# Patient Record
Sex: Male | Born: 2000 | Hispanic: No | Marital: Single | Smoking: Never smoker
Health system: Southern US, Community
[De-identification: ages and names within clinical notes are randomized; demographics above are authoritative.]

## PROBLEM LIST (undated history)

## (undated) ENCOUNTER — Emergency Department (HOSPITAL_BASED_OUTPATIENT_CLINIC_OR_DEPARTMENT_OTHER): Admission: EM | Payer: No Typology Code available for payment source | Source: Home / Self Care

## (undated) DIAGNOSIS — L439 Lichen planus, unspecified: Secondary | ICD-10-CM

## (undated) DIAGNOSIS — L661 Lichen planopilaris, unspecified: Secondary | ICD-10-CM

## (undated) DIAGNOSIS — S92403A Displaced unspecified fracture of unspecified great toe, initial encounter for closed fracture: Secondary | ICD-10-CM

## (undated) DIAGNOSIS — L732 Hidradenitis suppurativa: Secondary | ICD-10-CM

## (undated) HISTORY — PX: DENTAL SURGERY: SHX609

## (undated) HISTORY — PX: OTHER SURGICAL HISTORY: SHX169

## (undated) HISTORY — DX: Lichen planopilaris: L66.1

## (undated) HISTORY — DX: Displaced unspecified fracture of unspecified great toe, initial encounter for closed fracture: S92.403A

---
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
## 2020-01-22 ENCOUNTER — Other Ambulatory Visit: Payer: Self-pay

## 2020-01-22 ENCOUNTER — Emergency Department (HOSPITAL_COMMUNITY)
Admission: EM | Admit: 2020-01-22 | Discharge: 2020-01-22 | Disposition: A | Discharge status: Home / Self Care | Payer: Medicaid Other | Attending: Emergency Medicine | Admitting: Emergency Medicine

## 2020-01-22 ENCOUNTER — Encounter (HOSPITAL_COMMUNITY): Payer: Self-pay

## 2020-01-22 ENCOUNTER — Emergency Department (HOSPITAL_COMMUNITY): Payer: Medicaid Other

## 2020-01-22 DIAGNOSIS — M7918 Myalgia, other site: Secondary | ICD-10-CM

## 2020-01-22 DIAGNOSIS — M25512 Pain in left shoulder: Secondary | ICD-10-CM | POA: Diagnosis not present

## 2020-01-22 DIAGNOSIS — Y9389 Activity, other specified: Secondary | ICD-10-CM | POA: Diagnosis not present

## 2020-01-22 DIAGNOSIS — M79622 Pain in left upper arm: Secondary | ICD-10-CM | POA: Insufficient documentation

## 2020-01-22 DIAGNOSIS — Y998 Other external cause status: Secondary | ICD-10-CM | POA: Insufficient documentation

## 2020-01-22 DIAGNOSIS — Y92410 Unspecified street and highway as the place of occurrence of the external cause: Secondary | ICD-10-CM | POA: Insufficient documentation

## 2020-01-22 MED ORDER — METHOCARBAMOL 750 MG PO TABS
750.0000 mg | ORAL_TABLET | Freq: Four times a day (QID) | ORAL | 0 refills | Status: DC
Start: 2020-01-22 — End: 2022-11-17

## 2020-01-22 MED ORDER — IBUPROFEN 600 MG PO TABS: 600.0000 mg | ORAL_TABLET | Freq: Four times a day (QID) | ORAL | 0 refills | Status: AC | PRN
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
## 2020-04-27 DIAGNOSIS — J029 Acute pharyngitis, unspecified: Secondary | ICD-10-CM | POA: Diagnosis not present

## 2020-04-27 DIAGNOSIS — R059 Cough, unspecified: Secondary | ICD-10-CM | POA: Diagnosis not present

## 2020-04-27 DIAGNOSIS — R0981 Nasal congestion: Secondary | ICD-10-CM | POA: Diagnosis not present

## 2020-10-01 DIAGNOSIS — H5213 Myopia, bilateral: Secondary | ICD-10-CM | POA: Diagnosis not present

## 2020-10-13 ENCOUNTER — Ambulatory Visit: Payer: Medicaid Other | Admitting: Pediatrics

## 2021-01-18 ENCOUNTER — Telehealth: Payer: Self-pay | Admitting: Pediatrics

## 2021-01-18 NOTE — Telephone Encounter (Signed)
Left a voice message for Nicholas Farmer to call [facility] for appointment to address concern.

## 2021-01-18 NOTE — Telephone Encounter (Signed)
Patient lvm requesting a referral for Dermatology.

## 2021-01-30 DIAGNOSIS — Z6835 Body mass index (BMI) 35.0-35.9, adult: Secondary | ICD-10-CM | POA: Diagnosis not present

## 2021-01-30 DIAGNOSIS — L700 Acne vulgaris: Secondary | ICD-10-CM | POA: Diagnosis not present

## 2021-06-26 DIAGNOSIS — Z0000 Encounter for general adult medical examination without abnormal findings: Secondary | ICD-10-CM | POA: Diagnosis not present

## 2021-06-28 DIAGNOSIS — L659 Nonscarring hair loss, unspecified: Secondary | ICD-10-CM | POA: Diagnosis not present

## 2021-06-28 DIAGNOSIS — L989 Disorder of the skin and subcutaneous tissue, unspecified: Secondary | ICD-10-CM | POA: Diagnosis not present

## 2021-08-16 DIAGNOSIS — L659 Nonscarring hair loss, unspecified: Secondary | ICD-10-CM | POA: Diagnosis not present

## 2021-09-02 DIAGNOSIS — L669 Cicatricial alopecia, unspecified: Secondary | ICD-10-CM | POA: Diagnosis not present

## 2021-12-13 ENCOUNTER — Other Ambulatory Visit: Payer: Self-pay | Admitting: Pediatrics

## 2021-12-13 DIAGNOSIS — L700 Acne vulgaris: Secondary | ICD-10-CM

## 2021-12-13 DIAGNOSIS — L661 Lichen planopilaris: Secondary | ICD-10-CM | POA: Diagnosis not present

## 2021-12-18 ENCOUNTER — Telehealth: Payer: Self-pay

## 2021-12-18 DIAGNOSIS — Z09 Encounter for follow-up examination after completed treatment for conditions other than malignant neoplasm: Secondary | ICD-10-CM

## 2021-12-18 NOTE — Telephone Encounter (Signed)
[facility] mailed pt Adolescent Transition Letter. Pt has not been seen since 2021. Mychart message also sent indicating pt need to transition to adult PCP. Pt can contact [facility] if needing assistance.     Kenn File, BSW, QP Case Manager [facility] [employer] [facility] Office: [phone] Direct Number: [phone]

## 2021-12-27 DIAGNOSIS — L700 Acne vulgaris: Secondary | ICD-10-CM | POA: Diagnosis not present

## 2021-12-27 DIAGNOSIS — L661 Lichen planopilaris: Secondary | ICD-10-CM | POA: Diagnosis not present

## 2022-01-27 DIAGNOSIS — N489 Disorder of penis, unspecified: Secondary | ICD-10-CM | POA: Diagnosis not present

## 2022-01-27 DIAGNOSIS — Z113 Encounter for screening for infections with a predominantly sexual mode of transmission: Secondary | ICD-10-CM | POA: Diagnosis not present

## 2022-03-12 DIAGNOSIS — L661 Lichen planopilaris: Secondary | ICD-10-CM | POA: Diagnosis not present

## 2022-04-23 ENCOUNTER — Ambulatory Visit: Payer: Medicaid Other | Admitting: Family Medicine

## 2022-05-17 ENCOUNTER — Emergency Department (HOSPITAL_BASED_OUTPATIENT_CLINIC_OR_DEPARTMENT_OTHER)
Admission: EM | Admit: 2022-05-17 | Discharge: 2022-05-18 | Disposition: A | Discharge status: Home / Self Care | Payer: Medicaid Other | Attending: Emergency Medicine | Admitting: Emergency Medicine

## 2022-05-17 ENCOUNTER — Encounter (HOSPITAL_BASED_OUTPATIENT_CLINIC_OR_DEPARTMENT_OTHER): Payer: Self-pay

## 2022-05-17 ENCOUNTER — Emergency Department (HOSPITAL_BASED_OUTPATIENT_CLINIC_OR_DEPARTMENT_OTHER): Payer: Medicaid Other

## 2022-05-17 ENCOUNTER — Emergency Department (HOSPITAL_BASED_OUTPATIENT_CLINIC_OR_DEPARTMENT_OTHER): Payer: Medicaid Other | Admitting: Radiology

## 2022-05-17 ENCOUNTER — Other Ambulatory Visit: Payer: Self-pay

## 2022-05-17 DIAGNOSIS — S199XXA Unspecified injury of neck, initial encounter: Secondary | ICD-10-CM | POA: Diagnosis not present

## 2022-05-17 DIAGNOSIS — S0003XA Contusion of scalp, initial encounter: Secondary | ICD-10-CM | POA: Diagnosis not present

## 2022-05-17 DIAGNOSIS — Y92481 Parking lot as the place of occurrence of the external cause: Secondary | ICD-10-CM | POA: Insufficient documentation

## 2022-05-17 DIAGNOSIS — S62657A Nondisplaced fracture of medial phalanx of left little finger, initial encounter for closed fracture: Secondary | ICD-10-CM | POA: Diagnosis not present

## 2022-05-17 DIAGNOSIS — S6992XA Unspecified injury of left wrist, hand and finger(s), initial encounter: Secondary | ICD-10-CM | POA: Diagnosis present

## 2022-05-17 DIAGNOSIS — M7989 Other specified soft tissue disorders: Secondary | ICD-10-CM | POA: Diagnosis not present

## 2022-05-17 DIAGNOSIS — M79641 Pain in right hand: Secondary | ICD-10-CM | POA: Diagnosis not present

## 2022-05-17 MED ORDER — CEPHALEXIN 500 MG PO CAPS: 500.0000 mg | ORAL_CAPSULE | Freq: Three times a day (TID) | ORAL | 0 refills | Status: AC
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
## 2022-05-17 NOTE — Discharge Instructions (Addendum)
You were evaluated in the Emergency Department and after careful evaluation, we did not find any emergent condition requiring admission or further testing in the hospital.  Your exam/testing today was overall reassuring.  Your x-rays show a broken pinky finger.  CT scans did not show any injuries to your head or neck.  Follow-up with the hand specialist provided to ensure proper healing.  Use the splint.  Use Tylenol or Motrin as needed for pain.  Take the Keflex antibiotic to prevent infection.  Please return to the Emergency Department if you experience any worsening of your condition.  Thank you for allowing [place] to be a part of your care.

## 2022-06-19 DIAGNOSIS — Z7251 High risk heterosexual behavior: Secondary | ICD-10-CM | POA: Diagnosis not present

## 2022-06-19 DIAGNOSIS — Z114 Encounter for screening for human immunodeficiency virus [HIV]: Secondary | ICD-10-CM | POA: Diagnosis not present

## 2022-09-16 DIAGNOSIS — L661 Lichen planopilaris: Secondary | ICD-10-CM | POA: Diagnosis not present

## 2022-11-01 ENCOUNTER — Encounter (HOSPITAL_BASED_OUTPATIENT_CLINIC_OR_DEPARTMENT_OTHER): Payer: Self-pay

## 2022-11-01 ENCOUNTER — Emergency Department (HOSPITAL_BASED_OUTPATIENT_CLINIC_OR_DEPARTMENT_OTHER)
Admission: EM | Admit: 2022-11-01 | Discharge: 2022-11-01 | Disposition: A | Discharge status: Home / Self Care | Payer: Medicaid Other | Attending: Emergency Medicine | Admitting: Emergency Medicine

## 2022-11-01 DIAGNOSIS — B9789 Other viral agents as the cause of diseases classified elsewhere: Secondary | ICD-10-CM | POA: Diagnosis not present

## 2022-11-01 DIAGNOSIS — J029 Acute pharyngitis, unspecified: Secondary | ICD-10-CM | POA: Insufficient documentation

## 2022-11-01 DIAGNOSIS — J028 Acute pharyngitis due to other specified organisms: Secondary | ICD-10-CM | POA: Diagnosis not present

## 2022-11-01 DIAGNOSIS — Z1152 Encounter for screening for COVID-19: Secondary | ICD-10-CM | POA: Insufficient documentation

## 2022-11-01 LAB — SARS CORONAVIRUS 2 BY RT PCR: SARS Coronavirus 2 by RT PCR: NEGATIVE

## 2022-11-01 LAB — GROUP A STREP BY PCR: Group A Strep by PCR: NOT DETECTED

## 2022-11-01 MED ORDER — NAPROXEN 375 MG PO TABS: 375.0000 mg | ORAL_TABLET | Freq: Two times a day (BID) | ORAL | 0 refills | Status: DC

## 2022-11-01 MED ORDER — METHYLPREDNISOLONE 4 MG PO TBPK: ORAL_TABLET | ORAL | 0 refills | Status: DC

## 2022-11-01 MED ORDER — DEXAMETHASONE SODIUM PHOSPHATE 10 MG/ML IJ SOLN
10.0000 mg | Freq: Once | INTRAMUSCULAR | Status: AC
  Administered 2022-11-01: 10 mg via INTRAMUSCULAR
  Filled 2022-11-01: qty 1

## 2022-11-01 MED ORDER — KETOROLAC TROMETHAMINE 60 MG/2ML IM SOLN
60.0000 mg | Freq: Once | INTRAMUSCULAR | Status: AC
  Administered 2022-11-01: 60 mg via INTRAMUSCULAR
  Filled 2022-11-01: qty 2

## 2022-11-01 NOTE — ED Triage Notes (Signed)
Sore throat for three days.  States pain to swallow.  Cough congestion

## 2022-11-01 NOTE — Discharge Instructions (Signed)
Contact a health care provider if: You have large, tender lumps in your neck. You have a rash. You cough up green, yellow-brown, or bloody mucus. Get help right away if: Your neck becomes stiff. You drool or are unable to swallow liquids. You cannot drink or take medicines without vomiting. You have severe pain that does not go away, even after you take medicine. You have trouble breathing, and it is not caused by a stuffy nose. You have new pain and swelling in your joints such as the knees, ankles, wrists, or elbows. These symptoms may represent a serious problem that is an emergency. Do not wait to see if the symptoms will go away. Get medical help right away. Call your local emergency services (911 in the [place] Do not drive yourself to the hospital. 

## 2022-11-01 NOTE — ED Provider Notes (Signed)
[place] EMERGENCY DEPARTMENT AT [facility] Provider Note   CSN: [id number] Arrival date & time: 11/01/22  2045     History  Chief Complaint  Patient presents with   Sore Throat    Nicholas Farmer is a 22 y.o. male presents emergency department chief complaint of sore throat.  Patient states that he has had 3 days of progressively worsening sore throat.  He is able to swallow fluids.  He denies fever or cough.  No contacts with similar symptoms.  No change in voice.    Sore Throat       Home Medications Prior to Admission medications   Medication Sig Start Date End Date Taking? Authorizing Provider  acetaminophen (TYLENOL) 500 MG tablet Take 500 mg by mouth every 6 (six) hours as needed for moderate pain or headache.    [provider]  Clindamycin-Benzoyl Per, Refr, gel Apply to acne lesions twice a day.  Wash face and let dry before application. 06/10/19   Maree Erie, MD  DIFFERIN 0.1 % gel Apply to acne lesions at bedtime when needed 06/10/19   Maree Erie, MD  ibuprofen (ADVIL) 600 MG tablet Take 1 tablet (600 mg total) by mouth every 6 (six) hours as needed. 01/22/20   Lorre Nick, MD  ibuprofen (ADVIL,MOTRIN) 200 MG tablet Take 600 mg by mouth every 6 (six) hours as needed for headache or moderate pain.    [provider]  methocarbamol (ROBAXIN-750) 750 MG tablet Take 1 tablet (750 mg total) by mouth 4 (four) times daily. 01/22/20   Lorre Nick, MD      Allergies    Patient has no known allergies.    Review of Systems   Review of Systems  Physical Exam Updated Vital Signs BP 131/77 (BP Location: Right Arm)   Pulse 71   Temp 98.2 F (36.8 C) (Oral)   Resp 18   Ht 5\' 10"  (1.778 m)   Wt 111.1 kg   SpO2 96%   BMI 35.15 kg/m  Physical Exam Vitals and nursing note reviewed.  Constitutional:      General: He is not in acute distress.    Appearance: He is well-developed. He is not diaphoretic.  HENT:     Head:  Normocephalic and atraumatic.     Mouth/Throat:     Mouth: Mucous membranes are moist.     Pharynx: Uvula midline. Pharyngeal swelling, oropharyngeal exudate and posterior oropharyngeal erythema present. No uvula swelling.  Eyes:     General: No scleral icterus.    Conjunctiva/sclera: Conjunctivae normal.  Cardiovascular:     Rate and Rhythm: Normal rate and regular rhythm.     Heart sounds: Normal heart sounds.  Pulmonary:     Effort: Pulmonary effort is normal. No respiratory distress.     Breath sounds: Normal breath sounds.  Abdominal:     Palpations: Abdomen is soft.     Tenderness: There is no abdominal tenderness.  Musculoskeletal:     Cervical back: Normal range of motion and neck supple.  Skin:    General: Skin is warm and dry.  Neurological:     Mental Status: He is alert.  Psychiatric:        Behavior: Behavior normal.     ED Results / Procedures / Treatments   Labs (all labs ordered are listed, but only abnormal results are displayed) Labs Reviewed  SARS CORONAVIRUS 2 BY RT PCR  GROUP A STREP BY PCR    EKG None  Radiology No results found.  Procedures Procedures    Medications Ordered in ED Medications  ketorolac (TORADOL) injection 60 mg (has no administration in time range)  dexamethasone (DECADRON) injection 10 mg (has no administration in time range)    ED Course/ Medical Decision Making/ A&P                             Medical Decision Making Patient here with sore throat.  No evidence of peritonsillar abscess or deep space infection in the neck.  I ordered a COVID test which was negative, group A strep test negative.  Will treat with Decadron and Toradol, otherwise discharged with anti-inflammatories and Medrol Dosepak.  Patient appears otherwise appropriate for discharge at this time  Risk Prescription drug management.           Final Clinical Impression(s) / ED Diagnoses Final diagnoses:  Viral pharyngitis    Rx / DC  Orders ED Discharge Orders     None         Arthor Captain, PA-C 11/01/22 2348    Loetta Rough, MD 11/04/22 0025

## 2022-11-17 ENCOUNTER — Emergency Department (HOSPITAL_BASED_OUTPATIENT_CLINIC_OR_DEPARTMENT_OTHER)
Admission: EM | Admit: 2022-11-17 | Discharge: 2022-11-17 | Disposition: A | Discharge status: Home / Self Care | Payer: Medicaid Other | Attending: Emergency Medicine | Admitting: Emergency Medicine

## 2022-11-17 ENCOUNTER — Other Ambulatory Visit: Payer: Self-pay

## 2022-11-17 ENCOUNTER — Encounter (HOSPITAL_BASED_OUTPATIENT_CLINIC_OR_DEPARTMENT_OTHER): Payer: Self-pay

## 2022-11-17 DIAGNOSIS — L02214 Cutaneous abscess of groin: Secondary | ICD-10-CM | POA: Insufficient documentation

## 2022-11-17 MED ORDER — HYDROCODONE-ACETAMINOPHEN 5-325 MG PO TABS
2.0000 | ORAL_TABLET | Freq: Once | ORAL | Status: AC
  Administered 2022-11-17: 2 via ORAL
  Filled 2022-11-17: qty 2

## 2022-11-17 MED ORDER — DOXYCYCLINE HYCLATE 100 MG PO TABS
100.0000 mg | ORAL_TABLET | Freq: Once | ORAL | Status: AC
  Administered 2022-11-17: 100 mg via ORAL
  Filled 2022-11-17: qty 1

## 2022-11-17 MED ORDER — DOXYCYCLINE HYCLATE 100 MG PO CAPS: 100.0000 mg | ORAL_CAPSULE | Freq: Two times a day (BID) | ORAL | 0 refills | Status: DC

## 2022-11-17 MED ORDER — LIDOCAINE HCL (PF) 1 % IJ SOLN
5.0000 mL | Freq: Once | INTRAMUSCULAR | Status: AC
  Administered 2022-11-17: 5 mL via INTRADERMAL
  Filled 2022-11-17: qty 5

## 2022-11-17 NOTE — ED Triage Notes (Signed)
Abscess left side suprapubic area

## 2022-11-17 NOTE — ED Notes (Signed)
Offered pt a snack with pain meds.  He declined. States he will eat when he gets home.

## 2022-11-17 NOTE — ED Provider Notes (Signed)
[place] EMERGENCY DEPARTMENT AT [facility] Provider Note   CSN: [id number] Arrival date & time: 11/17/22  0243     History  Chief Complaint  Patient presents with   Abscess    Nicholas Farmer is a 22 y.o. male.  Patient is a 22 year old male presenting with complaints of pain and swelling in the right inguinal region.  He had a small area that drained pus earlier today.  He denies any fevers or chills.  He denies any injury or trauma  The history is provided by the patient.       Home Medications Prior to Admission medications   Medication Sig Start Date End Date Taking? Authorizing Provider  acetaminophen (TYLENOL) 500 MG tablet Take 500 mg by mouth every 6 (six) hours as needed for moderate pain or headache.    [provider]  Clindamycin-Benzoyl Per, Refr, gel Apply to acne lesions twice a day.  Wash face and let dry before application. 06/10/19   Maree Erie, MD  DIFFERIN 0.1 % gel Apply to acne lesions at bedtime when needed 06/10/19   Maree Erie, MD  ibuprofen (ADVIL) 600 MG tablet Take 1 tablet (600 mg total) by mouth every 6 (six) hours as needed. 01/22/20   Lorre Nick, MD  ibuprofen (ADVIL,MOTRIN) 200 MG tablet Take 600 mg by mouth every 6 (six) hours as needed for headache or moderate pain.    [provider]  methocarbamol (ROBAXIN-750) 750 MG tablet Take 1 tablet (750 mg total) by mouth 4 (four) times daily. 01/22/20   Lorre Nick, MD  methylPREDNISolone (MEDROL DOSEPAK) 4 MG TBPK tablet Use as directed 11/01/22   Arthor Captain, PA-C  naproxen (NAPROSYN) 375 MG tablet Take 1 tablet (375 mg total) by mouth 2 (two) times daily with a meal. 11/01/22   Arthor Captain, PA-C      Allergies    Patient has no known allergies.    Review of Systems   Review of Systems  All other systems reviewed and are negative.   Physical Exam Updated Vital Signs BP 124/74 (BP Location: Right Arm)   Pulse 76   Temp 97.8 F (36.6 C)  (Oral)   Resp 16   Ht 5\' 10"  (1.778 m)   Wt 111.1 kg   SpO2 99%   BMI 35.15 kg/m  Physical Exam Vitals and nursing note reviewed.  Constitutional:      Appearance: Normal appearance.  HENT:     Head: Normocephalic and atraumatic.  Pulmonary:     Effort: Pulmonary effort is normal.  Skin:    General: Skin is warm and dry.     Comments: In the left inguinal region, there is a 2 cm x 3 cm swollen, indurated, fluctuant lesion.  There is overlying erythema.  Neurological:     Mental Status: He is alert and oriented to person, place, and time.     ED Results / Procedures / Treatments   Labs (all labs ordered are listed, but only abnormal results are displayed) Labs Reviewed - No data to display  EKG None  Radiology No results found.  Procedures Procedures   INCISION AND DRAINAGE Performed by: Geoffery Lyons Consent: Verbal consent obtained. Risks and benefits: risks, benefits and alternatives were discussed Type: abscess  Body area: Left inguinal region  Anesthesia: local infiltration  Incision was made with a scalpel.  Local anesthetic: lidocaine 1% without epinephrine  Anesthetic total: 2 ml  Complexity: complex Blunt dissection to break up loculations  Drainage: purulent  Drainage amount: Moderate  Packing material: No packing placed  Patient tolerance: Patient tolerated the procedure well with no immediate complications.    Medications Ordered in ED Medications  lidocaine (PF) (XYLOCAINE) 1 % injection 5 mL (has no administration in time range)    ED Course/ Medical Decision Making/ A&P  Abscess incised and drained as above.  Patient will be discharged with doxycycline, warm compresses, and return as needed.  Final Clinical Impression(s) / ED Diagnoses Final diagnoses:  None    Rx / DC Orders ED Discharge Orders     None         Geoffery Lyons, MD 11/17/22 [phone]

## 2022-11-17 NOTE — ED Notes (Signed)
Pt requesting something for pain prior to d/c. Will update MD.

## 2022-11-17 NOTE — Discharge Instructions (Signed)
Begin taking doxycycline as prescribed.  Apply warm compresses as frequently as possible for the next several days.  Return to the emergency department if your symptoms significantly worsen or change. 

## 2022-11-19 DIAGNOSIS — L661 Lichen planopilaris: Secondary | ICD-10-CM | POA: Diagnosis not present

## 2023-01-02 DIAGNOSIS — L700 Acne vulgaris: Secondary | ICD-10-CM | POA: Diagnosis not present

## 2023-01-02 DIAGNOSIS — L661 Lichen planopilaris: Secondary | ICD-10-CM | POA: Diagnosis not present

## 2023-01-02 DIAGNOSIS — L739 Follicular disorder, unspecified: Secondary | ICD-10-CM | POA: Diagnosis not present

## 2023-01-15 DIAGNOSIS — L02214 Cutaneous abscess of groin: Secondary | ICD-10-CM | POA: Diagnosis not present

## 2023-02-13 DIAGNOSIS — L739 Follicular disorder, unspecified: Secondary | ICD-10-CM | POA: Diagnosis not present

## 2023-02-13 DIAGNOSIS — L700 Acne vulgaris: Secondary | ICD-10-CM | POA: Diagnosis not present

## 2023-02-13 DIAGNOSIS — L661 Lichen planopilaris: Secondary | ICD-10-CM | POA: Diagnosis not present

## 2023-03-12 DIAGNOSIS — L732 Hidradenitis suppurativa: Secondary | ICD-10-CM | POA: Diagnosis not present

## 2023-03-18 DIAGNOSIS — H5213 Myopia, bilateral: Secondary | ICD-10-CM | POA: Diagnosis not present

## 2023-03-24 DIAGNOSIS — L732 Hidradenitis suppurativa: Secondary | ICD-10-CM | POA: Diagnosis not present

## 2023-03-27 DIAGNOSIS — L739 Follicular disorder, unspecified: Secondary | ICD-10-CM | POA: Diagnosis not present

## 2023-03-27 DIAGNOSIS — L700 Acne vulgaris: Secondary | ICD-10-CM | POA: Diagnosis not present

## 2023-03-27 DIAGNOSIS — L661 Lichen planopilaris: Secondary | ICD-10-CM | POA: Diagnosis not present

## 2023-04-10 ENCOUNTER — Encounter (HOSPITAL_BASED_OUTPATIENT_CLINIC_OR_DEPARTMENT_OTHER): Payer: Self-pay

## 2023-04-10 ENCOUNTER — Emergency Department (HOSPITAL_BASED_OUTPATIENT_CLINIC_OR_DEPARTMENT_OTHER)
Admission: EM | Admit: 2023-04-10 | Discharge: 2023-04-10 | Disposition: A | Discharge status: Home / Self Care | Payer: Medicaid Other | Attending: Emergency Medicine | Admitting: Emergency Medicine

## 2023-04-10 ENCOUNTER — Other Ambulatory Visit: Payer: Self-pay

## 2023-04-10 DIAGNOSIS — L0291 Cutaneous abscess, unspecified: Secondary | ICD-10-CM

## 2023-04-10 DIAGNOSIS — L02214 Cutaneous abscess of groin: Secondary | ICD-10-CM | POA: Insufficient documentation

## 2023-04-10 MED ORDER — KETOROLAC TROMETHAMINE 60 MG/2ML IM SOLN
60.0000 mg | Freq: Once | INTRAMUSCULAR | Status: AC
  Administered 2023-04-10: 60 mg via INTRAMUSCULAR
  Filled 2023-04-10: qty 2

## 2023-04-10 MED ORDER — OXYCODONE-ACETAMINOPHEN 5-325 MG PO TABS: 1.0000 | ORAL_TABLET | Freq: Four times a day (QID) | ORAL | 0 refills | Status: DC | PRN

## 2023-04-10 MED ORDER — OXYCODONE-ACETAMINOPHEN 5-325 MG PO TABS
2.0000 | ORAL_TABLET | Freq: Once | ORAL | Status: AC
  Administered 2023-04-10: 2 via ORAL
  Filled 2023-04-10: qty 2

## 2023-04-10 MED ORDER — LIDOCAINE-EPINEPHRINE (PF) 2 %-1:200000 IJ SOLN
10.0000 mL | Freq: Once | INTRAMUSCULAR | Status: AC
  Administered 2023-04-10: 10 mL
  Filled 2023-04-10: qty 20

## 2023-04-10 NOTE — ED Provider Notes (Signed)
[place] EMERGENCY DEPARTMENT AT [facility] Provider Note   CSN: [id number] Arrival date & time: 04/10/23  0206     History  Chief Complaint  Patient presents with   Abscess    Nicholas Farmer is a 22 y.o. male.  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  Also has a history of abscesses in the groin.  Had at incision and drainage a few months ago.  This wound has been there for couple weeks but just recently got more painful and more swollen.  Fevers.  No urinary issues.  No severe pain.   Abscess      Home Medications Prior to Admission medications   Medication Sig Start Date End Date Taking? Authorizing Provider  oxyCODONE-acetaminophen (PERCOCET) 5-325 MG tablet Take 1 tablet by mouth every 6 (six) hours as needed. 04/10/23  Yes Sharisa Toves, Barbara Cower, MD  acetaminophen (TYLENOL) 500 MG tablet Take 500 mg by mouth every 6 (six) hours as needed for moderate pain or headache.    [provider]  Clindamycin-Benzoyl Per, Refr, gel Apply to acne lesions twice a day.  Wash face and let dry before application. 06/10/19   Maree Erie, MD  DIFFERIN 0.1 % gel Apply to acne lesions at bedtime when needed 06/10/19   Maree Erie, MD  doxycycline (VIBRAMYCIN) 100 MG capsule Take 1 capsule (100 mg total) by mouth 2 (two) times daily. One po bid x 7 days 11/17/22   Geoffery Lyons, MD  hydroxychloroquine (PLAQUENIL) 200 MG tablet Take 200 mg by mouth daily.    [provider]  ibuprofen (ADVIL) 600 MG tablet Take 1 tablet (600 mg total) by mouth every 6 (six) hours as needed. 01/22/20   Lorre Nick, MD  ibuprofen (ADVIL,MOTRIN) 200 MG tablet Take 600 mg by mouth every 6 (six) hours as needed for headache or moderate pain.    [provider]      Allergies    Patient has no known allergies.    Review of Systems   Review of Systems  Physical Exam Updated Vital Signs BP (!) 144/86   Pulse 80   Temp 98.3 F  (36.8 C) (Oral)   Resp 20   Ht 5\' 10"  (1.778 m)   Wt 111.1 kg   SpO2 100%   BMI 35.14 kg/m  Physical Exam Vitals and nursing note reviewed.  Constitutional:      Appearance: He is well-developed.  HENT:     Head: Normocephalic and atraumatic.  Cardiovascular:     Rate and Rhythm: Normal rate.  Pulmonary:     Effort: Pulmonary effort is normal. No respiratory distress.  Abdominal:     General: There is no distension.  Genitourinary:    Comments: 2 x 3 cm fluctuant area in the left inguinal fold.  No surrounding cellulitis.  Does have some induration in other areas associated with multiple healing open wounds.  No crepitus or tenderness elsewhere. Musculoskeletal:        General: Normal range of motion.     Cervical back: Normal range of motion.  Neurological:     Mental Status: He is alert.     ED Results / Procedures / Treatments   Labs (all labs ordered are listed, but only abnormal results are displayed) Labs Reviewed - No data to display  EKG None  Radiology No results found.  Procedures .Marland KitchenIncision and Drainage  Date/Time: 04/10/2023 4:54 AM  Performed by: Marily Memos, MD  Authorized by: Marily Memos, MD   Consent:    Consent obtained:  Verbal   Consent given by:  Patient   Risks, benefits, and alternatives were discussed: yes     Risks discussed:  Bleeding, incomplete drainage, pain, infection and damage to other organs   Alternatives discussed:  No treatment and delayed treatment Universal protocol:    Procedure explained and questions answered to patient or proxy's satisfaction: yes     Relevant documents present and verified: yes     Patient identity confirmed:  Verbally with patient Location:    Type:  Abscess   Size:  3x2   Location:  Anogenital   Anogenital location: left inguinal fold. Pre-procedure details:    Skin preparation:  Povidone-iodine Sedation:    Sedation type:  None Anesthesia:    Anesthesia method:  Local infiltration    Local anesthetic:  Lidocaine 2% WITH epi Procedure type:    Complexity:  Simple Procedure details:    Incision types:  Single straight   Wound management:  Probed and deloculated and irrigated with saline   Drainage:  Bloody and purulent   Drainage amount:  Moderate   Wound treatment:  Wound left open Post-procedure details:    Procedure completion:  Tolerated     Medications Ordered in ED Medications  oxyCODONE-acetaminophen (PERCOCET/ROXICET) 5-325 MG per tablet 2 tablet (2 tablets Oral Given 04/10/23 0313)  ketorolac (TORADOL) injection 60 mg (60 mg Intramuscular Given 04/10/23 0313)  lidocaine-EPINEPHrine (XYLOCAINE W/EPI) 2 %-1:200000 (PF) injection 10 mL (10 mLs Infiltration Given 04/10/23 1610)    ED Course/ Medical Decision Making/ A&P                                 Medical Decision Making Risk Prescription drug management.  No s/s of fourniers. Abscess I&D as above. Already on abx. Has derm follow up. 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   Final Clinical Impression(s) / ED Diagnoses Final diagnoses:  Abscess    Rx / DC Orders ED Discharge Orders          Ordered    oxyCODONE-acetaminophen (PERCOCET) 5-325 MG tablet  Every 6 hours PRN        04/10/23 0432              Chantilly Linskey, Barbara Cower, MD 04/10/23 [phone]

## 2023-04-10 NOTE — Discharge Instructions (Signed)
Abscesses and cellulitis are often caused by bacteria that live on your skin naturally all the time.  One way to get rid of this is to put a cup of bleach in a bathtub of water soaking for 10 minutes a day for a week.  If you do this, realize the bleach will stain your clothes, towels, carpets, rugs and anything else with die.  It will also make your feet slippery when used about a tub to be careful not to fall.  Another thing that works very well is chlorhexidine washes.  You can buy chlorhexidine wipes at the store and cleanse your whole body with them once a day for 7 days along with putting mupirocin ointment in your nose and finding chlorhexidine mouthwash to rinse her mouth with twice a day.

## 2023-04-10 NOTE — ED Triage Notes (Addendum)
POV from home, A&O x 4, GCS 15, amb to triage  Pt c/o abscess to left groin area, no drainage. 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

## 2023-04-18 ENCOUNTER — Encounter (HOSPITAL_BASED_OUTPATIENT_CLINIC_OR_DEPARTMENT_OTHER): Payer: Self-pay

## 2023-04-18 ENCOUNTER — Emergency Department (HOSPITAL_BASED_OUTPATIENT_CLINIC_OR_DEPARTMENT_OTHER)
Admission: EM | Admit: 2023-04-18 | Discharge: 2023-04-18 | Disposition: A | Discharge status: Home / Self Care | Payer: Medicaid Other | Attending: Emergency Medicine | Admitting: Emergency Medicine

## 2023-04-18 ENCOUNTER — Other Ambulatory Visit: Payer: Self-pay

## 2023-04-18 DIAGNOSIS — L732 Hidradenitis suppurativa: Secondary | ICD-10-CM | POA: Diagnosis not present

## 2023-04-18 HISTORY — DX: Lichen planopilaris, unspecified: L66.10

## 2023-04-18 HISTORY — DX: Lichen planus, unspecified: L43.9

## 2023-04-18 MED ORDER — PREDNISONE 20 MG PO TABS: ORAL_TABLET | ORAL | 0 refills | Status: AC

## 2023-04-18 MED ORDER — CLINDAMYCIN HCL 150 MG PO CAPS: 150.0000 mg | ORAL_CAPSULE | Freq: Three times a day (TID) | ORAL | 0 refills | Status: DC

## 2023-04-18 NOTE — Discharge Instructions (Addendum)
It was a pleasure taking care of you today. You have a condition called Hidradenitis Suppurativa. Please contact:   Doctor Tammi Sou, MD He is at [facility] Clinical trials   Phone Number:  [phone]  Tell them you were referred by Dr. Hassan Rowan   A good resource to understand your condition is the [email or web address] website.   Sincerely,  Manuela Neptune, MD

## 2023-04-18 NOTE — ED Triage Notes (Signed)
In for eval of abscesses to groin area. New abscess to right groin this am that he wants to be drained. Has been seeing his dermatologist and been on several antibiotics. Diagnosed with lichen planus and lichen planopilaris.

## 2023-04-18 NOTE — ED Provider Notes (Addendum)
EMERGENCY DEPARTMENT AT [facility] Provider Note   CSN: [id number] Arrival date & time: 04/18/23  4098     History Chief Complaint  Patient presents with   Abscess    HPI Nicholas Farmer is a 22 y.o. male presenting for a recurrent abscesses on his groin and buttocks. He was previously treated with Doxycycline and Clindamycin, Prednisone. He found little relief with Clindamycin, no relief with Doxycycline. Hx of acne.   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  Patient's recorded medical, surgical, social, medication list and allergies were reviewed in the Snapshot window as part of the initial history.   Review of Systems   Review of Systems  Constitutional:  Negative for chills and fever.  Has many abscesses on face, buttocks and suprapubic region  Physical Exam Updated Vital Signs BP (!) 149/85 (BP Location: Right Arm)   Pulse 66   Temp 98.4 F (36.9 C) (Oral)   Resp 16   Ht 5\' 10"  (1.778 m)   Wt 111.1 kg   SpO2 99%   BMI 35.14 kg/m  Physical Exam Cardiovascular:     Rate and Rhythm: Normal rate and regular rhythm.     Heart sounds: No murmur heard.    No friction rub. No gallop.  Pulmonary:     Effort: No respiratory distress.     Breath sounds: No wheezing, rhonchi or rales.  Skin:    Comments: Draining nodules, tunnels, and pustules over the suprapubic region.  Pustules and nodules on cheeks, mouth and chin.    ED Course/ Medical Decision Making/ A&P    Medical Decision Making:    Nicholas Farmer is a 22 y.o. male who presented to the ED today with complaints of recurrent abscesses, scarring and tunneling as detailed above.     Patient's presentation is complicated by their history of acne.   Complete initial physical exam performed, notably the patient had multiple draining nodules and tunnels.       Reviewed and confirmed nursing documentation for past medical history,  family history, social history.    Initial Assessment:   With the patient's presentation, the most likely diagnosis is Hidradenitis Suppurativa. Other diagnoses were considered including (but not limited to) abscess, bacterial skin infection. These are considered less likely due to history of present illness and physical exam findings.    Reassessment and Plan:    The patient has Hidradenitis Suppurativa Hurley Stage III. Has found some but minor relief with antibiotics. Will most likely need to go on a biological. Has seen a dermatologist but has come to the ED/Urgent care about 5 times since April for drainage of recurrent abscesses. The nature of the condition was explained to him, resources and education provided. All questions answered. Patient was not taking clindamycin as prescribed, refill for two weeks sent, quick prednisone taper for pain relief and contact information for Dr. Tammi Sou (HS Specialist) at [facility] provided.  Clinical Impression:  1. Hidradenitis suppurativa      Discharge   Final Clinical Impression(s) / ED Diagnoses Final diagnoses:  Hidradenitis suppurativa    Rx / DC Orders ED Discharge Orders          Ordered    clindamycin (CLEOCIN) 150 MG capsule  3 times daily        04/18/23 0816    predniSONE (DELTASONE) 20 MG tablet  Q breakfast        04/18/23 0816  Hassan Rowan Washington, MD 04/18/23 3220    Manuela Neptune, MD 04/18/23 2542    Melene Plan, DO 04/18/23 7062

## 2023-05-14 DIAGNOSIS — H5213 Myopia, bilateral: Secondary | ICD-10-CM | POA: Diagnosis not present

## 2023-05-28 ENCOUNTER — Other Ambulatory Visit: Payer: Self-pay

## 2023-05-28 ENCOUNTER — Emergency Department (HOSPITAL_BASED_OUTPATIENT_CLINIC_OR_DEPARTMENT_OTHER)
Admission: EM | Admit: 2023-05-28 | Discharge: 2023-05-29 | Disposition: A | Discharge status: Home / Self Care | Payer: Medicaid Other | Attending: Emergency Medicine | Admitting: Emergency Medicine

## 2023-05-28 ENCOUNTER — Encounter (HOSPITAL_BASED_OUTPATIENT_CLINIC_OR_DEPARTMENT_OTHER): Payer: Self-pay

## 2023-05-28 DIAGNOSIS — L0231 Cutaneous abscess of buttock: Secondary | ICD-10-CM | POA: Insufficient documentation

## 2023-05-28 DIAGNOSIS — L0291 Cutaneous abscess, unspecified: Secondary | ICD-10-CM

## 2023-05-28 MED ORDER — LIDOCAINE-EPINEPHRINE (PF) 2 %-1:200000 IJ SOLN
10.0000 mL | Freq: Once | INTRAMUSCULAR | Status: AC
  Administered 2023-05-28: 10 mL
  Filled 2023-05-28: qty 20

## 2023-05-28 MED ORDER — OXYCODONE HCL 5 MG PO TABS
5.0000 mg | ORAL_TABLET | Freq: Once | ORAL | Status: AC
  Administered 2023-05-28: 5 mg via ORAL
  Filled 2023-05-28: qty 1

## 2023-05-28 NOTE — ED Notes (Addendum)
Lidocaine and tray at bedside. Suture cart at door. Pt states he has ride home.

## 2023-05-28 NOTE — ED Triage Notes (Signed)
Pt c/o abscess to top of buttocks x 1 week, hx of same multiple times

## 2023-05-29 MED ORDER — OXYCODONE HCL 5 MG PO TABS: 5.0000 mg | ORAL_TABLET | Freq: Four times a day (QID) | ORAL | 0 refills | Status: DC | PRN

## 2023-05-29 NOTE — Discharge Instructions (Signed)
You have been seen and discharged from the emergency department.  Incision and drainage was performed on your buttocks abscess.  Continue taking clindamycin.  Take pain medicine as needed.  Do not mix this medication with alcohol or other sedating medications. Do not drive or do heavy physical activity until you know how this medication affects you.  It may cause drowsiness.  You will continue to have drainage from the area, this is normal and expected.  Follow-up with your primary provider for further evaluation and further care. Take home medications as prescribed. If you have any worsening symptoms or further concerns for your health please return to an emergency department for further evaluation.

## 2023-05-29 NOTE — ED Provider Notes (Signed)
[place] EMERGENCY DEPARTMENT AT [facility] Provider Note   CSN: [id number] Arrival date & time: 05/28/23  2053     History  Chief Complaint  Patient presents with   Abscess    Nicholas Farmer is a 22 y.o. male.  HPI   22 year old male with past medical history of multiple subcutaneous abscesses, following up as an outpatient, currently on clindamycin presents emergency department with concern for swelling and tenderness at the top of his gluteal cleft.  This is been ongoing for couple days.  1 lower portion has opened and is freely draining on its own however he is having worsening swelling of the upper portion.  Denies any fever, chills or systemic symptoms.  He is being followed as an outpatient and has a follow-up appointment next week for further management.  Home Medications Prior to Admission medications   Medication Sig Start Date End Date Taking? Authorizing Provider  acetaminophen (TYLENOL) 500 MG tablet Take 500 mg by mouth every 6 (six) hours as needed for moderate pain or headache.    [provider]  clindamycin (CLEOCIN) 150 MG capsule Take 1 capsule (150 mg total) by mouth 3 (three) times daily. 04/18/23   Alexander-Savino, [place], MD  Clindamycin-Benzoyl Per, Refr, gel Apply to acne lesions twice a day.  Wash face and let dry before application. 06/10/19   Maree Erie, MD  DIFFERIN 0.1 % gel Apply to acne lesions at bedtime when needed 06/10/19   Maree Erie, MD  doxycycline (VIBRAMYCIN) 100 MG capsule Take 1 capsule (100 mg total) by mouth 2 (two) times daily. One po bid x 7 days 11/17/22   Geoffery Lyons, MD  hydroxychloroquine (PLAQUENIL) 200 MG tablet Take 200 mg by mouth daily.    [provider]  ibuprofen (ADVIL) 600 MG tablet Take 1 tablet (600 mg total) by mouth every 6 (six) hours as needed. 01/22/20   Lorre Nick, MD  ibuprofen (ADVIL,MOTRIN) 200 MG tablet Take 600 mg by mouth every 6 (six) hours as needed for  headache or moderate pain.    [provider]  oxyCODONE-acetaminophen (PERCOCET) 5-325 MG tablet Take 1 tablet by mouth every 6 (six) hours as needed. 04/10/23   Mesner, Barbara Cower, MD      Allergies    Patient has no known allergies.    Review of Systems   Review of Systems  Constitutional:  Negative for chills and fever.  Respiratory:  Negative for shortness of breath.   Cardiovascular:  Negative for chest pain.  Gastrointestinal:  Negative for abdominal pain.  Skin:        Skin abscess    Physical Exam Updated Vital Signs BP 124/77   Pulse 80   Temp 97.8 F (36.6 C)   Resp 17   Ht 5\' 10"  (1.778 m)   Wt 110.7 kg   SpO2 100%   BMI 35.01 kg/m  Physical Exam Vitals and nursing note reviewed.  Constitutional:      General: He is not in acute distress.    Appearance: Normal appearance.  HENT:     Head: Normocephalic.     Mouth/Throat:     Mouth: Mucous membranes are moist.  Cardiovascular:     Rate and Rhythm: Normal rate.  Pulmonary:     Effort: Pulmonary effort is normal. No respiratory distress.  Abdominal:     Palpations: Abdomen is soft.     Tenderness: There is no abdominal tenderness.  Genitourinary:    Comments: Approximately 4 x  2 cm indurated and fluctuant area just to the left of his gluteal cleft.  The lower portion is draining clear fluid.  Induration does not cross midline or extend lower into the buttocks Skin:    General: Skin is warm.  Neurological:     Mental Status: He is alert and oriented to person, place, and time. Mental status is at baseline.  Psychiatric:        Mood and Affect: Mood normal.     ED Results / Procedures / Treatments   Labs (all labs ordered are listed, but only abnormal results are displayed) Labs Reviewed - No data to display  EKG None  Radiology No results found.  Procedures .Marland KitchenIncision and Drainage  Date/Time: 05/29/2023 12:15 AM  Performed by: Rozelle Logan, DO Authorized by: Rozelle Logan, DO    Consent:    Consent obtained:  Verbal   Risks discussed:  Bleeding, incomplete drainage, infection and damage to other organs Universal protocol:    Patient identity confirmed:  Verbally with patient Location:    Type:  Abscess   Location:  Anogenital   Anogenital location:  Gluteal cleft Pre-procedure details:    Skin preparation:  Antiseptic wash Sedation:    Sedation type:  None Anesthesia:    Anesthesia method:  Local infiltration   Local anesthetic:  Lidocaine 2% WITH epi Procedure type:    Complexity:  Simple Procedure details:    Ultrasound guidance: no     Needle aspiration: no     Incision types:  Stab incision   Incision depth:  Submucosal   Wound management:  Probed and deloculated, irrigated with saline and extensive cleaning   Drainage:  Purulent and serosanguinous   Drainage amount:  Moderate   Wound treatment:  Wound left open   Packing materials:  None Post-procedure details:    Procedure completion:  Tolerated     Medications Ordered in ED Medications  oxyCODONE (Oxy IR/ROXICODONE) immediate release tablet 5 mg (5 mg Oral Given 05/28/23 2247)  lidocaine-EPINEPHrine (XYLOCAINE W/EPI) 2 %-1:200000 (PF) injection 10 mL (10 mLs Infiltration Given by Other 05/28/23 2248)    ED Course/ Medical Decision Making/ A&P                                 Medical Decision Making Risk Prescription drug management.   22 year old male presents emergency department with buttocks abscess.  History of multiple abscesses in the past requiring incision and drainage.  Currently following as an outpatient with further testing and follow-up next week.  On oral clindamycin.  Vitals are normal and stable.  He denies any systemic symptoms.  There is an elongated 4 x 2 area of induration and fluctuance just to the left of his gluteal cleft.  The lower portion seems to be freely draining but the upper portion is fluctuant and tender.  Incision and drainage performed with success  and relief.  Will leave the wound open, he is already on clindamycin he will follow-up next week with his scheduled outpatient follow-up.  Patient at this time appears safe and stable for discharge and close outpatient follow up. Discharge plan and strict return to ED precautions discussed, patient verbalizes understanding and agreement.        Final Clinical Impression(s) / ED Diagnoses Final diagnoses:  None    Rx / DC Orders ED Discharge Orders     None  Rozelle Logan, DO 05/29/23 0017

## 2023-05-29 NOTE — ED Notes (Signed)
Wound dressed with sterile gauze. Addition supplies for home dressing. Follow up discussed. No questions after DC review.

## 2023-05-31 ENCOUNTER — Encounter (HOSPITAL_BASED_OUTPATIENT_CLINIC_OR_DEPARTMENT_OTHER): Payer: Self-pay

## 2023-05-31 DIAGNOSIS — L0231 Cutaneous abscess of buttock: Secondary | ICD-10-CM | POA: Insufficient documentation

## 2023-05-31 NOTE — ED Triage Notes (Signed)
 Pt c/o abscess to top of buttocks x 1 week, hx of same multiple times

## 2023-06-01 ENCOUNTER — Emergency Department (HOSPITAL_BASED_OUTPATIENT_CLINIC_OR_DEPARTMENT_OTHER)
Admission: EM | Admit: 2023-06-01 | Discharge: 2023-06-01 | Disposition: A | Discharge status: Home / Self Care | Payer: Medicaid Other | Attending: Emergency Medicine | Admitting: Emergency Medicine

## 2023-06-01 ENCOUNTER — Other Ambulatory Visit: Payer: Self-pay

## 2023-06-01 DIAGNOSIS — L0231 Cutaneous abscess of buttock: Secondary | ICD-10-CM

## 2023-06-01 MED ORDER — LIDOCAINE-EPINEPHRINE (PF) 2 %-1:200000 IJ SOLN
10.0000 mL | Freq: Once | INTRAMUSCULAR | Status: AC
  Administered 2023-06-01: 10 mL
  Filled 2023-06-01: qty 20

## 2023-06-01 MED ORDER — DOXYCYCLINE HYCLATE 100 MG PO CAPS: 100.0000 mg | ORAL_CAPSULE | Freq: Two times a day (BID) | ORAL | 0 refills | Status: DC

## 2023-06-01 MED ORDER — OXYCODONE-ACETAMINOPHEN 5-325 MG PO TABS
1.0000 | ORAL_TABLET | Freq: Once | ORAL | Status: AC
  Administered 2023-06-01: 1 via ORAL
  Filled 2023-06-01: qty 1

## 2023-06-01 NOTE — ED Provider Notes (Signed)
[place] EMERGENCY DEPARTMENT AT [facility]  Provider Note  CSN: [id number] Arrival date & time: 05/31/23 2247  History Chief Complaint  Patient presents with   Abscess    Nicholas Farmer is a 22 y.o. male with history of hidradenitis and recurrent abscesses was seen 3-4 days ago for an abscess on upper gluteal cleft which was drained and has been doing better. Today he woke up with another spot further down on his R buttock. Pain with sitting and moving, no drainage. He is taking clindamycin, scheduled to see HS clinic in [facility] soon.    Home Medications Prior to Admission medications   Medication Sig Start Date End Date Taking? Authorizing Provider  acetaminophen (TYLENOL) 500 MG tablet Take 500 mg by mouth every 6 (six) hours as needed for moderate pain or headache.    [provider]  clindamycin (CLEOCIN) 150 MG capsule Take 1 capsule (150 mg total) by mouth 3 (three) times daily. 04/18/23   Alexander-Savino, [place], MD  Clindamycin-Benzoyl Per, Refr, gel Apply to acne lesions twice a day.  Wash face and let dry before application. 06/10/19   Maree Erie, MD  DIFFERIN 0.1 % gel Apply to acne lesions at bedtime when needed 06/10/19   Maree Erie, MD  doxycycline (VIBRAMYCIN) 100 MG capsule Take 1 capsule (100 mg total) by mouth 2 (two) times daily. One po bid x 7 days 06/01/23   Pollyann Savoy, MD  hydroxychloroquine (PLAQUENIL) 200 MG tablet Take 200 mg by mouth daily.    [provider]  ibuprofen (ADVIL) 600 MG tablet Take 1 tablet (600 mg total) by mouth every 6 (six) hours as needed. 01/22/20   Lorre Nick, MD  ibuprofen (ADVIL,MOTRIN) 200 MG tablet Take 600 mg by mouth every 6 (six) hours as needed for headache or moderate pain.    [provider]  oxyCODONE (ROXICODONE) 5 MG immediate release tablet Take 1 tablet (5 mg total) by mouth every 6 (six) hours as needed for severe pain (pain score 7-10). 05/29/23   Horton,  Clabe Seal, DO     Allergies    Patient has no known allergies.   Review of Systems   Review of Systems Please see HPI for pertinent positives and negatives  Physical Exam BP (!) 147/73   Pulse 77   Temp 97.7 F (36.5 C)   Resp 18   Ht 5\' 10"  (1.778 m)   Wt 111.1 kg   SpO2 99%   BMI 35.15 kg/m   Physical Exam Vitals and nursing note reviewed.  HENT:     Head: Normocephalic.     Nose: Nose normal.  Eyes:     Extraocular Movements: Extraocular movements intact.  Pulmonary:     Effort: Pulmonary effort is normal.  Musculoskeletal:        General: Normal range of motion.     Cervical back: Neck supple.     Comments: 3cmx3cm area of induration, tenderness and central fluctuance to R gluteal cleft  Skin:    Findings: No rash (on exposed skin).  Neurological:     Mental Status: He is alert and oriented to person, place, and time.  Psychiatric:        Mood and Affect: Mood normal.     ED Results / Procedures / Treatments   EKG None  Procedures .Marland KitchenIncision and Drainage  Date/Time: 06/01/2023 3:36 AM  Performed by: Pollyann Savoy, MD Authorized by: Pollyann Savoy, MD   Consent:  Consent obtained:  Verbal   Consent given by:  Patient Location:    Type:  Abscess   Location:  Anogenital   Anogenital location:  Gluteal cleft Pre-procedure details:    Skin preparation:  Povidone-iodine Sedation:    Sedation type:  None Anesthesia:    Anesthesia method:  Local infiltration   Local anesthetic:  Lidocaine 2% WITH epi Procedure type:    Complexity:  Simple Procedure details:    Incision types:  Single straight   Wound management:  Probed and deloculated   Drainage:  Purulent   Drainage amount:  Moderate   Wound treatment:  Wound left open Post-procedure details:    Procedure completion:  Tolerated well, no immediate complications   Medications Ordered in the ED Medications  oxyCODONE-acetaminophen (PERCOCET/ROXICET) 5-325 MG per tablet 1  tablet (1 tablet Oral Given 06/01/23 0251)  lidocaine-EPINEPHrine (XYLOCAINE W/EPI) 2 %-1:200000 (PF) injection 10 mL (10 mLs Infiltration Given 06/01/23 0251)    Initial Impression and Plan  Patient here with recurrent abscess, drained as above. Rx for Abx, follow up with HS clinic as planned.   ED Course       MDM Rules/Calculators/A&P Medical Decision Making Problems Addressed: Abscess of right buttock: acute illness or injury  Risk Prescription drug management.     Final Clinical Impression(s) / ED Diagnoses Final diagnoses:  Abscess of right buttock    Rx / DC Orders ED Discharge Orders          Ordered    doxycycline (VIBRAMYCIN) 100 MG capsule  2 times daily        06/01/23 0337             Pollyann Savoy, MD 06/01/23 [phone]

## 2023-06-02 DIAGNOSIS — Z006 Encounter for examination for normal comparison and control in clinical research program: Secondary | ICD-10-CM | POA: Diagnosis not present

## 2023-06-05 DIAGNOSIS — L0591 Pilonidal cyst without abscess: Secondary | ICD-10-CM | POA: Diagnosis not present

## 2023-06-05 DIAGNOSIS — L0201 Cutaneous abscess of face: Secondary | ICD-10-CM | POA: Diagnosis not present

## 2023-06-30 DIAGNOSIS — L0201 Cutaneous abscess of face: Secondary | ICD-10-CM | POA: Diagnosis not present

## 2023-07-13 ENCOUNTER — Encounter (HOSPITAL_BASED_OUTPATIENT_CLINIC_OR_DEPARTMENT_OTHER): Payer: Self-pay | Admitting: Emergency Medicine

## 2023-07-13 ENCOUNTER — Other Ambulatory Visit: Payer: Self-pay

## 2023-07-13 ENCOUNTER — Emergency Department (HOSPITAL_BASED_OUTPATIENT_CLINIC_OR_DEPARTMENT_OTHER)
Admission: EM | Admit: 2023-07-13 | Discharge: 2023-07-13 | Disposition: A | Discharge status: Home / Self Care | Payer: Medicaid Other | Attending: Emergency Medicine | Admitting: Emergency Medicine

## 2023-07-13 DIAGNOSIS — L0291 Cutaneous abscess, unspecified: Secondary | ICD-10-CM

## 2023-07-13 DIAGNOSIS — L0231 Cutaneous abscess of buttock: Secondary | ICD-10-CM | POA: Insufficient documentation

## 2023-07-13 DIAGNOSIS — Z23 Encounter for immunization: Secondary | ICD-10-CM | POA: Insufficient documentation

## 2023-07-13 MED ORDER — LIDOCAINE HCL (PF) 1 % IJ SOLN
10.0000 mL | Freq: Once | INTRAMUSCULAR | Status: AC
  Administered 2023-07-13: 10 mL
  Filled 2023-07-13: qty 10

## 2023-07-13 MED ORDER — DOXYCYCLINE HYCLATE 100 MG PO CAPS: 100.0000 mg | ORAL_CAPSULE | Freq: Two times a day (BID) | ORAL | 0 refills | Status: DC

## 2023-07-13 MED ORDER — DOXYCYCLINE HYCLATE 100 MG PO TABS
100.0000 mg | ORAL_TABLET | Freq: Once | ORAL | Status: AC
  Administered 2023-07-13: 100 mg via ORAL
  Filled 2023-07-13: qty 1

## 2023-07-13 MED ORDER — TETANUS-DIPHTH-ACELL PERTUSSIS 5-2.5-18.5 LF-MCG/0.5 IM SUSY
0.5000 mL | PREFILLED_SYRINGE | Freq: Once | INTRAMUSCULAR | Status: AC
  Administered 2023-07-13: 0.5 mL via INTRAMUSCULAR
  Filled 2023-07-13: qty 0.5

## 2023-07-13 NOTE — ED Provider Notes (Signed)
[place] EMERGENCY DEPARTMENT AT [facility] Provider Note   CSN: [id number] Arrival date & time: 07/13/23  1326     History  Chief Complaint  Patient presents with   Abscess   HPI Nicholas Farmer is a 22 y.o. male with a history hidradenitis suppurativa presenting for abscess.  States he noticed what he describes as an abscess on his right mid buttock.  Noticed it last night states its painful.  Not oozing or bleeding.  States he is followed at [facility] for his HS.  Denies fever and chills.   Abscess      Home Medications Prior to Admission medications   Medication Sig Start Date End Date Taking? Authorizing Provider  doxycycline (VIBRAMYCIN) 100 MG capsule Take 1 capsule (100 mg total) by mouth 2 (two) times daily. 07/13/23  Yes Gareth Eagle, PA-C  acetaminophen (TYLENOL) 500 MG tablet Take 500 mg by mouth every 6 (six) hours as needed for moderate pain or headache.    [provider]  clindamycin (CLEOCIN) 150 MG capsule Take 1 capsule (150 mg total) by mouth 3 (three) times daily. 04/18/23   Alexander-Savino, [place], MD  Clindamycin-Benzoyl Per, Refr, gel Apply to acne lesions twice a day.  Wash face and let dry before application. 06/10/19   Maree Erie, MD  DIFFERIN 0.1 % gel Apply to acne lesions at bedtime when needed 06/10/19   Maree Erie, MD  hydroxychloroquine (PLAQUENIL) 200 MG tablet Take 200 mg by mouth daily.    [provider]  ibuprofen (ADVIL) 600 MG tablet Take 1 tablet (600 mg total) by mouth every 6 (six) hours as needed. 01/22/20   Lorre Nick, MD  ibuprofen (ADVIL,MOTRIN) 200 MG tablet Take 600 mg by mouth every 6 (six) hours as needed for headache or moderate pain.    [provider]  oxyCODONE (ROXICODONE) 5 MG immediate release tablet Take 1 tablet (5 mg total) by mouth every 6 (six) hours as needed for severe pain (pain score 7-10). 05/29/23   Horton, Clabe Seal, DO      Allergies    Patient  has no known allergies.    Review of Systems   See HPI  Physical Exam Updated Vital Signs BP 114/71   Pulse 77   Temp (!) 97.4 F (36.3 C) (Oral)   Resp 20   SpO2 100%  Physical Exam Constitutional:      Appearance: Normal appearance.  HENT:     Head: Normocephalic.     Nose: Nose normal.  Eyes:     Conjunctiva/sclera: Conjunctivae normal.  Pulmonary:     Effort: Pulmonary effort is normal.  Skin:    Findings: Abscess present.     Comments: Abscess noted in the right mid buttock.  Area of fluctuance approximately 1.5 cm in diameter.  It is tender to touch with palpation.  Not oozing or bleeding at this time.  No surrounding erythema or edema.  Skin is not indurated.  Neurological:     Mental Status: He is alert.  Psychiatric:        Mood and Affect: Mood normal.     ED Results / Procedures / Treatments   Labs (all labs ordered are listed, but only abnormal results are displayed) Labs Reviewed - No data to display  EKG None  Radiology No results found.  Procedures .Incision and Drainage  Date/Time: 07/13/2023 2:56 PM  Performed by: Gareth Eagle, PA-C Authorized by: Gareth Eagle, PA-C   Consent:  Consent obtained:  Verbal   Consent given by:  Patient   Risks discussed:  Bleeding, incomplete drainage, pain and damage to other organs   Alternatives discussed:  No treatment Universal protocol:    Procedure explained and questions answered to patient or proxy's satisfaction: yes     Relevant documents present and verified: yes     Test results available : yes     Imaging studies available: yes     Required blood products, implants, devices, and special equipment available: yes     Site/side marked: yes     Immediately prior to procedure, a time out was called: yes     Patient identity confirmed:  Verbally with patient Location:    Type:  Abscess Pre-procedure details:    Skin preparation:  Betadine Anesthesia:    Anesthesia method:  Local  infiltration   Local anesthetic:  Lidocaine 1% WITH epi Procedure type:    Complexity:  Complex Procedure details:    Incision types:  Single straight   Incision depth:  Subcutaneous   Wound management:  Probed and deloculated, irrigated with saline and extensive cleaning   Drainage:  Purulent   Drainage amount:  Moderate   Packing materials:  None Post-procedure details:    Procedure completion:  Tolerated well, no immediate complications     Medications Ordered in ED Medications  doxycycline (VIBRA-TABS) tablet 100 mg (has no administration in time range)  lidocaine (PF) (XYLOCAINE) 1 % injection 10 mL (10 mLs Infiltration Given 07/13/23 1426)  Tdap (BOOSTRIX) injection 0.5 mL (0.5 mLs Intramuscular Given 07/13/23 1423)    ED Course/ Medical Decision Making/ A&P                                 Medical Decision Making Risk Prescription drug management.   22 year old well-appearing male presenting for abscess. Exam notable for small abscess in the right mid buttock.  I&D procedure went very well.  See procedure note above for details.  Started on doxycycline. Advised for him to follow-up with his HS medical provider at [facility].  Discussed pertinent return precautions.  Sent doxycycline to his pharmacy.  Vital stable. Discharged in good condition.        Final Clinical Impression(s) / ED Diagnoses Final diagnoses:  Abscess    Rx / DC Orders ED Discharge Orders          Ordered    doxycycline (VIBRAMYCIN) 100 MG capsule  2 times daily        07/13/23 1457              Gareth Eagle, PA-C 07/13/23 1458    Gwyneth Sprout, MD 07/13/23 [phone]

## 2023-07-13 NOTE — Discharge Instructions (Addendum)
Evaluation today revealed he did have an abscess in the left mid buttock.  I&D procedure went well.  Starting on doxycycline.  Please follow-up with your HS medical provider.  If you develop a fever, worsening and radiating pain from the site, it begins to ooze pustulant discharge or any other concerning symptom please return emergency department further evaluation.

## 2023-07-13 NOTE — ED Triage Notes (Signed)
Abscess on right buttock. Noticed last night, painful. Hx of same, gets tx in [place].

## 2023-08-06 ENCOUNTER — Other Ambulatory Visit: Payer: Self-pay

## 2023-08-18 ENCOUNTER — Other Ambulatory Visit: Payer: Self-pay

## 2023-08-18 ENCOUNTER — Emergency Department (HOSPITAL_BASED_OUTPATIENT_CLINIC_OR_DEPARTMENT_OTHER)
Admission: EM | Admit: 2023-08-18 | Discharge: 2023-08-18 | Disposition: A | Discharge status: Home / Self Care | Payer: Medicaid Other | Attending: Emergency Medicine | Admitting: Emergency Medicine

## 2023-08-18 DIAGNOSIS — L732 Hidradenitis suppurativa: Secondary | ICD-10-CM | POA: Insufficient documentation

## 2023-08-18 LAB — RESP PANEL BY RT-PCR (RSV, FLU A&B, COVID)  RVPGX2
Influenza A by PCR: NEGATIVE
Influenza B by PCR: NEGATIVE
Resp Syncytial Virus by PCR: NEGATIVE
SARS Coronavirus 2 by RT PCR: NEGATIVE

## 2023-08-18 MED ORDER — CLINDAMYCIN HCL 300 MG PO CAPS: 300.0000 mg | ORAL_CAPSULE | Freq: Three times a day (TID) | ORAL | 0 refills | Status: AC

## 2023-08-18 MED ORDER — LIDOCAINE-EPINEPHRINE (PF) 2 %-1:200000 IJ SOLN
20.0000 mL | Freq: Once | INTRAMUSCULAR | Status: DC
  Filled 2023-08-18: qty 20

## 2023-08-18 MED ORDER — CLINDAMYCIN HCL 150 MG PO CAPS
300.0000 mg | ORAL_CAPSULE | Freq: Once | ORAL | Status: AC
  Administered 2023-08-18: 300 mg via ORAL
  Filled 2023-08-18: qty 2

## 2023-08-18 NOTE — ED Triage Notes (Signed)
Abscess left buttock near rectum. Has been drained several times. Afebrile.   Also reports diarrhea.

## 2023-08-18 NOTE — ED Provider Notes (Signed)
[place] EMERGENCY DEPARTMENT AT [facility] Provider Note   CSN: [id number] Arrival date & time: 08/18/23  2030     History Chief Complaint  Patient presents with   Abscess    HPI Nicholas Farmer is a 23 y.o. male presenting for hidradenitis flare.  He is a 23 year old male follows with [facility] dermatology for hidradenitis.  States that he had a breakthrough flare today.  States he has them approximately monthly after getting his immuno injections and states his immune injection 3 days ago.  Is on his left gluteal cleft and he states he cannot feel it himself though he states it does hurt a little bit.  Is just requesting a physical exam..   Patient's recorded medical, surgical, social, medication list and allergies were reviewed in the Snapshot window as part of the initial history.   Review of Systems   Review of Systems  Constitutional:  Negative for chills and fever.  HENT:  Negative for ear pain and sore throat.   Eyes:  Negative for pain and visual disturbance.  Respiratory:  Negative for cough and shortness of breath.   Cardiovascular:  Negative for chest pain and palpitations.  Gastrointestinal:  Negative for abdominal pain and vomiting.  Genitourinary:  Negative for dysuria and hematuria.  Musculoskeletal:  Negative for arthralgias and back pain.  Skin:  Negative for color change and rash.  Neurological:  Negative for seizures and syncope.  All other systems reviewed and are negative.   Physical Exam Updated Vital Signs BP 137/80   Pulse 80   Temp 98.1 F (36.7 C) (Oral)   Resp 16   SpO2 100%  Physical Exam Vitals and nursing note reviewed.  Constitutional:      General: He is not in acute distress.    Appearance: He is well-developed.  HENT:     Head: Normocephalic and atraumatic.  Eyes:     Conjunctiva/sclera: Conjunctivae normal.  Cardiovascular:     Rate and Rhythm: Normal rate and regular rhythm.     Heart sounds: No murmur  heard. Pulmonary:     Effort: Pulmonary effort is normal. No respiratory distress.     Breath sounds: Normal breath sounds.  Abdominal:     Palpations: Abdomen is soft.     Tenderness: There is no abdominal tenderness.  Musculoskeletal:        General: Swelling present.     Cervical back: Neck supple.  Skin:    General: Skin is warm and dry.     Capillary Refill: Capillary refill takes less than 2 seconds.  Neurological:     Mental Status: He is alert.  Psychiatric:        Mood and Affect: Mood normal.      ED Course/ Medical Decision Making/ A&P    Procedures .Ultrasound ED Soft Tissue  Date/Time: 08/18/2023 10:29 PM  Performed by: Glyn Ade, MD Authorized by: Glyn Ade, MD   Procedure details:    Indications: localization of abscess     Transverse view:  Visualized   Longitudinal view:  Visualized   Images: archived     Limitations:  Body habitus Location:    Location: buttocks     Side:  Left Findings:     abscess present    cellulitis present    Medications Ordered in ED Medications  lidocaine-EPINEPHrine (XYLOCAINE W/EPI) 2 %-1:200000 (PF) injection 20 mL (has no administration in time range)  clindamycin (CLEOCIN) capsule 300 mg (has no administration in time range)  Medical Decision Making:   23 year old male with history of hidradenitis presenting with a left gluteal cleft abscess. Ultrasound showed a 0.7 mL fluid collection. Discussed with the patient that in the setting of hidradenitis, repeat I&D of these small lesions is likely not indicated and patient was in agreement.  States that he would rather trial antibiotics and follow-up with his dermatologist if the lesion is small.  Given the current presentation I believe clindamycin, ambulatory follow-up with his primary care provider would be indicated with incision and drainage only if the symptoms are progressively worsening.  Patient ambulatory tolerating p.o. intake and in  agreement at this time.  Notably, it is substantially separated from both his pilonidal space and his rectum.  Disposition:  I have considered need for hospitalization, however, considering all of the above, I believe this patient is stable for discharge at this time.  Patient/family educated about specific return precautions for given chief complaint and symptoms.  Patient/family educated about follow-up with PCP.     Patient/family expressed understanding of return precautions and need for follow-up. Patient spoken to regarding all imaging and laboratory results and appropriate follow up for these results. All education provided in verbal form with additional information in written form. Time was allowed for answering of patient questions. Patient discharged.    Emergency Department Medication Summary:   Medications  lidocaine-EPINEPHrine (XYLOCAINE W/EPI) 2 %-1:200000 (PF) injection 20 mL (has no administration in time range)  clindamycin (CLEOCIN) capsule 300 mg (has no administration in time range)        Clinical Impression:  1. Hidradenitis      Data Unavailable   Final Clinical Impression(s) / ED Diagnoses Final diagnoses:  Hidradenitis    Rx / DC Orders ED Discharge Orders          Ordered    clindamycin (CLEOCIN) 300 MG capsule  3 times daily        08/18/23 2231              Glyn Ade, MD 08/18/23 2231

## 2023-09-18 ENCOUNTER — Encounter (HOSPITAL_BASED_OUTPATIENT_CLINIC_OR_DEPARTMENT_OTHER): Payer: Self-pay

## 2023-09-18 ENCOUNTER — Other Ambulatory Visit: Payer: Self-pay

## 2023-09-18 DIAGNOSIS — N454 Abscess of epididymis or testis: Secondary | ICD-10-CM | POA: Diagnosis present

## 2023-09-18 DIAGNOSIS — L0231 Cutaneous abscess of buttock: Secondary | ICD-10-CM | POA: Diagnosis not present

## 2023-09-18 NOTE — ED Triage Notes (Addendum)
 Pt POV d/t abscess on his scrotum for 2 days golf ball size.   Pt has hx of Hydradenitis suppurative and doing a clinical trial and gets IV med at [facility] 1x monthly. He had Feb 19th.  He states some times gets after that. Pt is currently on Clindamycin

## 2023-09-19 ENCOUNTER — Emergency Department (HOSPITAL_BASED_OUTPATIENT_CLINIC_OR_DEPARTMENT_OTHER)
Admission: EM | Admit: 2023-09-19 | Discharge: 2023-09-19 | Disposition: A | Discharge status: Home / Self Care | Payer: Medicaid Other | Attending: Emergency Medicine | Admitting: Emergency Medicine

## 2023-09-19 DIAGNOSIS — L0291 Cutaneous abscess, unspecified: Secondary | ICD-10-CM

## 2023-09-19 HISTORY — DX: Hidradenitis suppurativa: L73.2

## 2023-09-19 NOTE — Discharge Instructions (Signed)
 Perform sitz bath's or apply warm compresses as frequently as possible for the next several days.  Return to the ER if symptoms significantly worsen or change.

## 2023-09-19 NOTE — ED Provider Notes (Signed)
 [place] EMERGENCY DEPARTMENT AT [facility] Provider Note   CSN: [id number] Arrival date & time: 09/18/23  2343     History  Chief Complaint  Patient presents with   Abscess    Nicholas Farmer is a 23 y.o. male.  Patient is a 23 year old male with history of hidradenitis suppurativa with recurrent abscess formation presenting with complaints of abscess underneath his testicles.  This started earlier today.  It did seem to burst on its own and has been draining blood.  Patient concerned that it may need to be incised and drained to further.  No fevers or chills.  He is currently undergoing treatment and a clinical trial at [facility] for HS.  The history is provided by the patient.       Home Medications Prior to Admission medications   Medication Sig Start Date End Date Taking? Authorizing Provider  acetaminophen (TYLENOL) 500 MG tablet Take 500 mg by mouth every 6 (six) hours as needed for moderate pain or headache.    [provider]  clindamycin (CLEOCIN) 150 MG capsule Take 1 capsule (150 mg total) by mouth 3 (three) times daily. 04/18/23   Alexander-Savino, [place], MD  Clindamycin-Benzoyl Per, Refr, gel Apply to acne lesions twice a day.  Wash face and let dry before application. 06/10/19   Maree Erie, MD  DIFFERIN 0.1 % gel Apply to acne lesions at bedtime when needed 06/10/19   Maree Erie, MD  doxycycline (VIBRAMYCIN) 100 MG capsule Take 1 capsule (100 mg total) by mouth 2 (two) times daily. 07/13/23   Gareth Eagle, PA-C  hydroxychloroquine (PLAQUENIL) 200 MG tablet Take 200 mg by mouth daily.    [provider]  ibuprofen (ADVIL) 600 MG tablet Take 1 tablet (600 mg total) by mouth every 6 (six) hours as needed. 01/22/20   Lorre Nick, MD  ibuprofen (ADVIL,MOTRIN) 200 MG tablet Take 600 mg by mouth every 6 (six) hours as needed for headache or moderate pain.    [provider]  oxyCODONE (ROXICODONE) 5 MG immediate release  tablet Take 1 tablet (5 mg total) by mouth every 6 (six) hours as needed for severe pain (pain score 7-10). 05/29/23   Horton, Clabe Seal, DO      Allergies    Patient has no known allergies.    Review of Systems   Review of Systems  All other systems reviewed and are negative.   Physical Exam Updated Vital Signs BP (!) 146/75 (BP Location: Right Arm)   Pulse 69   Temp (!) 96.8 F (36 C)   Resp 18   Ht 5\' 10"  (1.778 m)   Wt 107 kg   SpO2 97%   BMI 33.86 kg/m  Physical Exam Vitals and nursing note reviewed.  Constitutional:      Appearance: Normal appearance.  Pulmonary:     Effort: Pulmonary effort is normal.  Genitourinary:    Testes: Normal.     Comments: Posterior to the testicles, there is a small, draining abscess.  There is no significant surrounding erythema or fluctuance. Skin:    General: Skin is warm and dry.  Neurological:     Mental Status: He is alert.     ED Results / Procedures / Treatments   Labs (all labs ordered are listed, but only abnormal results are displayed) Labs Reviewed - No data to display  EKG None  Radiology No results found.  Procedures Procedures    Medications Ordered in ED Medications - No  data to display  ED Course/ Medical Decision Making/ A&P  Abscess area has drained spontaneously with no fluctuance or significant surrounding erythema.  I feel as though patient is not in need of additional drainage and incision and is appropriate for discharged with sitz bath/warm soaks and follow-up as needed.  Final Clinical Impression(s) / ED Diagnoses Final diagnoses:  None    Rx / DC Orders ED Discharge Orders     None         Geoffery Lyons, MD 09/19/23 0300

## 2023-11-10 ENCOUNTER — Other Ambulatory Visit: Payer: Self-pay

## 2023-11-10 ENCOUNTER — Emergency Department (HOSPITAL_COMMUNITY)
Admission: EM | Admit: 2023-11-10 | Discharge: 2023-11-11 | Disposition: A | Discharge status: Home / Self Care | Attending: Emergency Medicine | Admitting: Emergency Medicine

## 2023-11-10 DIAGNOSIS — H6001 Abscess of right external ear: Secondary | ICD-10-CM | POA: Diagnosis not present

## 2023-11-10 MED ORDER — LIDOCAINE-EPINEPHRINE (PF) 2 %-1:200000 IJ SOLN
10.0000 mL | Freq: Once | INTRAMUSCULAR | Status: AC
  Administered 2023-11-11: 10 mL
  Filled 2023-11-10: qty 20

## 2023-11-10 NOTE — ED Provider Notes (Signed)
 [place] EMERGENCY DEPARTMENT AT [facility] Provider Note   CSN: [id number] Arrival date & time: 11/10/23  2036     History {Add pertinent medical, surgical, social history, OB history to HPI:1} Chief Complaint  Patient presents with   Abscess    Patient stated "I have a abscess behind my right ear and I wanted to get it checked out" Patient stated that he got it drained about 3-4 months ago and thought it would better but it has not went away. No other complaints at this time. Hx HS    Nicholas Farmer is a 23 y.o. male with past medical history significant for hidradenitis suppurativa who presents with concern for recurrent abscess behind his right ear.  He reports that a few months ago it was drained he was placed on some antibiotics but it has come back since.  He denies any fever, chills.   Abscess      Home Medications Prior to Admission medications   Medication Sig Start Date End Date Taking? Authorizing Provider  acetaminophen  (TYLENOL ) 500 MG tablet Take 500 mg by mouth every 6 (six) hours as needed for moderate pain or headache.    [provider]  clindamycin  (CLEOCIN ) 150 MG capsule Take 1 capsule (150 mg total) by mouth 3 (three) times daily. 04/18/23   Alexander-Savino, [place], MD  Clindamycin -Benzoyl Per, Refr, gel Apply to acne lesions twice a day.  Wash face and let dry before application. 06/10/19   Carlynn Chiles, MD  DIFFERIN  0.1 % gel Apply to acne lesions at bedtime when needed 06/10/19   Carlynn Chiles, MD  doxycycline  (VIBRAMYCIN ) 100 MG capsule Take 1 capsule (100 mg total) by mouth 2 (two) times daily. 07/13/23   Robinson, John K, PA-C  hydroxychloroquine (PLAQUENIL) 200 MG tablet Take 200 mg by mouth daily.    [provider]  ibuprofen  (ADVIL ) 600 MG tablet Take 1 tablet (600 mg total) by mouth every 6 (six) hours as needed. 01/22/20   Lind Repine, MD  ibuprofen  (ADVIL ,MOTRIN ) 200 MG tablet Take 600 mg by mouth every  6 (six) hours as needed for headache or moderate pain.    [provider]  oxyCODONE  (ROXICODONE ) 5 MG immediate release tablet Take 1 tablet (5 mg total) by mouth every 6 (six) hours as needed for severe pain (pain score 7-10). 05/29/23   Horton, Kristie M, DO      Allergies    Patient has no known allergies.    Review of Systems   Review of Systems  All other systems reviewed and are negative.   Physical Exam Updated Vital Signs BP (!) 142/85 (BP Location: Left Arm)   Pulse 63   Temp 98.8 F (37.1 C) (Oral)   Resp 16   SpO2 99%  Physical Exam Vitals and nursing note reviewed.  Constitutional:      General: He is not in acute distress.    Appearance: Normal appearance.  HENT:     Head: Normocephalic and atraumatic.  Eyes:     General:        Right eye: No discharge.        Left eye: No discharge.  Cardiovascular:     Rate and Rhythm: Normal rate and regular rhythm.  Pulmonary:     Effort: Pulmonary effort is normal. No respiratory distress.  Musculoskeletal:        General: No deformity.  Skin:    General: Skin is warm and dry.  Comments: Focal 2 x 3 cm abscess just behind the right pinna, with some fluctuance, and small focal drainage.  Some surrounding skin redness, induration.  No significant mastoid bone tenderness.  Neurological:     Mental Status: He is alert and oriented to person, place, and time.  Psychiatric:        Mood and Affect: Mood normal.        Behavior: Behavior normal.     ED Results / Procedures / Treatments   Labs (all labs ordered are listed, but only abnormal results are displayed) Labs Reviewed - No data to display  EKG None  Radiology No results found.  Procedures Procedures  {Document cardiac monitor, telemetry assessment procedure when appropriate:1}  Medications Ordered in ED Medications - No data to display  ED Course/ Medical Decision Making/ A&P   {   Click here for ABCD2, HEART and other  calculatorsREFRESH Note before signing :1}                              Medical Decision Making  ***  {Document critical care time when appropriate:1} {Document review of labs and clinical decision tools ie heart score, Chads2Vasc2 etc:1}  {Document your independent review of radiology images, and any outside records:1} {Document your discussion with family members, caretakers, and with consultants:1} {Document social determinants of health affecting pt's care:1} {Document your decision making why or why not admission, treatments were needed:1} Final Clinical Impression(s) / ED Diagnoses Final diagnoses:  None    Rx / DC Orders ED Discharge Orders     None

## 2023-11-11 MED ORDER — DOXYCYCLINE HYCLATE 100 MG PO CAPS: 100.0000 mg | ORAL_CAPSULE | Freq: Two times a day (BID) | ORAL | 0 refills | Status: AC

## 2023-11-11 MED ORDER — KETOROLAC TROMETHAMINE 15 MG/ML IJ SOLN
15.0000 mg | Freq: Once | INTRAMUSCULAR | Status: AC
  Administered 2023-11-11: 15 mg via INTRAMUSCULAR
  Filled 2023-11-11: qty 1

## 2023-11-11 NOTE — Discharge Instructions (Addendum)
 You may want to follow-up with the ear nose and throat doctor that I have placed contact information above to discuss a further excision if the abscess recurs in the same location.    Please take the entire course of antibiotics that I prescribed.

## 2023-11-19 ENCOUNTER — Other Ambulatory Visit: Payer: Self-pay

## 2023-11-19 ENCOUNTER — Emergency Department (HOSPITAL_BASED_OUTPATIENT_CLINIC_OR_DEPARTMENT_OTHER)
Admission: EM | Admit: 2023-11-19 | Discharge: 2023-11-19 | Disposition: A | Discharge status: Home / Self Care | Attending: Emergency Medicine | Admitting: Emergency Medicine

## 2023-11-19 ENCOUNTER — Encounter (HOSPITAL_BASED_OUTPATIENT_CLINIC_OR_DEPARTMENT_OTHER): Payer: Self-pay | Admitting: Emergency Medicine

## 2023-11-19 DIAGNOSIS — L02214 Cutaneous abscess of groin: Secondary | ICD-10-CM | POA: Diagnosis not present

## 2023-11-19 DIAGNOSIS — L0291 Cutaneous abscess, unspecified: Secondary | ICD-10-CM

## 2023-11-19 MED ORDER — LIDOCAINE-EPINEPHRINE (PF) 2 %-1:200000 IJ SOLN
10.0000 mL | Freq: Once | INTRAMUSCULAR | Status: AC
  Administered 2023-11-19: 10 mL
  Filled 2023-11-19: qty 20

## 2023-11-19 MED ORDER — CLINDAMYCIN HCL 150 MG PO CAPS: 150.0000 mg | ORAL_CAPSULE | Freq: Three times a day (TID) | ORAL | 0 refills | Status: AC

## 2023-11-19 NOTE — ED Notes (Signed)
 Dressing applied to drained abscess.

## 2023-11-19 NOTE — ED Triage Notes (Signed)
 Pt caox4, ambulatory c/o abscess to L groin area that he noticed a few days ago. Pt reports PMH abscess around his groin area that have needed I&D in the past. Denies fever, or any other complaint.

## 2023-11-19 NOTE — ED Provider Notes (Signed)
 [place] EMERGENCY DEPARTMENT AT [facility] Provider Note   CSN: [id number] Arrival date & time: 11/19/23  0745     History  Chief Complaint  Patient presents with   Abscess    Nicholas Farmer is a 23 y.o. male.   Abscess   23 year old male presents emergency department with complaints of abscess.  Patient reports 2 days of having swelling left inguinal region.  Reports history of hidradenitis suppurativa and states this feels like he has an abscess.  Denies any drainage.  Denies any fevers, chills.  Past medical history significant for hidradenitis  Home Medications Prior to Admission medications   Medication Sig Start Date End Date Taking? Authorizing Provider  acetaminophen  (TYLENOL ) 500 MG tablet Take 500 mg by mouth every 6 (six) hours as needed for moderate pain or headache.    [provider]  clindamycin  (CLEOCIN ) 150 MG capsule Take 1 capsule (150 mg total) by mouth 3 (three) times daily for 6 days. 11/19/23 11/25/23  [place] Butter, PA  Clindamycin -Benzoyl Per, Refr, gel Apply to acne lesions twice a day.  Wash face and let dry before application. 06/10/19   Carlynn Chiles, MD  DIFFERIN  0.1 % gel Apply to acne lesions at bedtime when needed 06/10/19   Carlynn Chiles, MD  doxycycline  (VIBRAMYCIN ) 100 MG capsule Take 1 capsule (100 mg total) by mouth 2 (two) times daily. 11/11/23   Prosperi, Christian H, PA-C  hydroxychloroquine (PLAQUENIL) 200 MG tablet Take 200 mg by mouth daily.    [provider]  ibuprofen  (ADVIL ) 600 MG tablet Take 1 tablet (600 mg total) by mouth every 6 (six) hours as needed. 01/22/20   Lind Repine, MD  ibuprofen  (ADVIL ,MOTRIN ) 200 MG tablet Take 600 mg by mouth every 6 (six) hours as needed for headache or moderate pain.    [provider]  oxyCODONE  (ROXICODONE ) 5 MG immediate release tablet Take 1 tablet (5 mg total) by mouth every 6 (six) hours as needed for severe pain (pain score 7-10). 05/29/23    Horton, Kristie M, DO      Allergies    Patient has no known allergies.    Review of Systems   Review of Systems  All other systems reviewed and are negative.   Physical Exam Updated Vital Signs BP 132/75 (BP Location: Right Arm)   Pulse 60   Temp 97.8 F (36.6 C)   Resp 16   SpO2 100%  Physical Exam Vitals and nursing note reviewed.  Constitutional:      General: He is not in acute distress.    Appearance: He is well-developed.  HENT:     Head: Normocephalic and atraumatic.  Eyes:     Conjunctiva/sclera: Conjunctivae normal.  Cardiovascular:     Rate and Rhythm: Normal rate and regular rhythm.     Heart sounds: No murmur heard. Pulmonary:     Effort: Pulmonary effort is normal. No respiratory distress.     Breath sounds: Normal breath sounds.  Abdominal:     Palpations: Abdomen is soft.     Tenderness: There is no abdominal tenderness.  Musculoskeletal:        General: No swelling.     Cervical back: Neck supple.  Skin:    General: Skin is warm and dry.     Capillary Refill: Capillary refill takes less than 2 seconds.     Comments: 2.3 cm palpable fluctuance just medial to left inguinal region.  Area tender to the touch with mild  overlying erythema.  Neurological:     Mental Status: He is alert.  Psychiatric:        Mood and Affect: Mood normal.     ED Results / Procedures / Treatments   Labs (all labs ordered are listed, but only abnormal results are displayed) Labs Reviewed - No data to display  EKG None  Radiology No results found.  Procedures .Incision and Drainage  Date/Time: 11/19/2023 9:40 AM  Performed by: [place] Butter, PA Authorized by: [place] Butter, PA   Consent:    Consent obtained:  Verbal   Consent given by:  Patient   Risks discussed:  Bleeding, incomplete drainage, pain and damage to other organs   Alternatives discussed:  No treatment Universal protocol:    Procedure explained and questions answered to patient or  proxy's satisfaction: yes     Relevant documents present and verified: yes     Test results available : yes     Imaging studies available: yes     Required blood products, implants, devices, and special equipment available: yes     Site/side marked: yes     Immediately prior to procedure, a time out was called: yes     Patient identity confirmed:  Verbally with patient Location:    Type:  Abscess   Size:  2.3   Location:  Anogenital   Anogenital location: Just medial to left inguinal region. Pre-procedure details:    Skin preparation:  Chlorhexidine with alcohol Anesthesia:    Anesthesia method:  Local infiltration   Local anesthetic:  Lidocaine  2% WITH epi Procedure type:    Complexity:  Simple Procedure details:    Incision types:  Single straight   Incision depth:  Subcutaneous   Wound management:  Probed and deloculated, irrigated with saline and extensive cleaning   Drainage:  Purulent   Drainage amount:  Moderate   Packing materials:  None Post-procedure details:    Procedure completion:  Tolerated well, no immediate complications     Medications Ordered in ED Medications  lidocaine -EPINEPHrine  (XYLOCAINE  W/EPI) 2 %-1:200000 (PF) injection 10 mL (10 mLs Infiltration Given 11/19/23 0903)    ED Course/ Medical Decision Making/ A&P                                 Medical Decision Making Risk Prescription drug management.   This patient presents to the ED for concern of abscess, this involves an extensive number of treatment options, and is a complaint that carries with it a high risk of complications and morbidity.  The differential diagnosis includes abscess, cellulitis, erysipelas, negative infection, lymphadenopathy, other   Co morbidities that complicate the patient evaluation  See HPI   Additional history obtained:  Additional history obtained from EMR External records from outside source obtained and reviewed including hospital records   Lab  Tests:  N/a   Imaging Studies ordered:  N/a   Cardiac Monitoring: / EKG:  N/a   Consultations Obtained:  N/a   Problem List / ED Course / Critical interventions / Medication management  Abscess I ordered medication including lidocaine  with epinephrine   Reevaluation of the patient after these medicines showed that the patient improved I have reviewed the patients home medicines and have made adjustments as needed   Social Determinants of Health:  Denies tobacco, illicit drug use.   Test / Admission - Considered:  Abscess Vitals signs within normal range and stable throughout  visit. 23 year old male presents emergency department with complaints of abscess.  Patient reports 2 days of having swelling left inguinal region.  Reports history of hidradenitis suppurativa and states this feels like he has an abscess.  Denies any drainage.  Denies any fevers, chills. On exam, 2.3 cm abscess just medial to left inguinal area as above.  Bedside ultrasound confirmed presence of fluid collection.  Area drained in manner as above.  Will place patient on antibiotics given surrounding erythema.  Recommend follow-up with dermatology for continued treatment for patient's HS.  Treatment plan discussed with patient and he acknowledged understanding was agreeable to said plan.  Patient overall well-appearing, afebrile in no acute distress. Worrisome signs and symptoms were discussed with the patient, and the patient acknowledged understanding to return to the ED if noticed. Patient was stable upon discharge.          Final Clinical Impression(s) / ED Diagnoses Final diagnoses:  Abscess    Rx / DC Orders      [place] Butter, PA 11/19/23 0940    Lowery Rue, DO 11/19/23 1147

## 2023-11-19 NOTE — Discharge Instructions (Signed)
 As discussed, keep area clean and dry.  Wash gently with warm soapy water.  Will place you on antibiotics to treat infection.  Recommend follow-up with your dermatologist reassessment.  Please do not hesitate to return if the worrisome signs and symptoms we discussed become apparent.

## 2023-12-26 ENCOUNTER — Emergency Department (HOSPITAL_BASED_OUTPATIENT_CLINIC_OR_DEPARTMENT_OTHER)
Admission: EM | Admit: 2023-12-26 | Discharge: 2023-12-26 | Disposition: A | Discharge status: Home / Self Care | Attending: Emergency Medicine | Admitting: Emergency Medicine

## 2023-12-26 ENCOUNTER — Other Ambulatory Visit: Payer: Self-pay

## 2023-12-26 ENCOUNTER — Encounter (HOSPITAL_BASED_OUTPATIENT_CLINIC_OR_DEPARTMENT_OTHER): Payer: Self-pay | Admitting: *Deleted

## 2023-12-26 DIAGNOSIS — L02215 Cutaneous abscess of perineum: Secondary | ICD-10-CM | POA: Diagnosis present

## 2023-12-26 DIAGNOSIS — L02214 Cutaneous abscess of groin: Secondary | ICD-10-CM

## 2023-12-26 MED ORDER — OXYCODONE HCL 5 MG PO TABS: 5.0000 mg | ORAL_TABLET | Freq: Four times a day (QID) | ORAL | 0 refills | Status: AC | PRN

## 2023-12-26 MED ORDER — CLINDAMYCIN HCL 150 MG PO CAPS: 300.0000 mg | ORAL_CAPSULE | Freq: Four times a day (QID) | ORAL | 0 refills | Status: AC

## 2023-12-26 MED ORDER — LIDOCAINE-EPINEPHRINE (PF) 2 %-1:200000 IJ SOLN
20.0000 mL | Freq: Once | INTRAMUSCULAR | Status: AC
  Administered 2023-12-26: 20 mL
  Filled 2023-12-26: qty 20

## 2023-12-26 NOTE — ED Triage Notes (Addendum)
 Pt to ED reporting painful abscess to the left side of groin x 1 day. No changes in urination, no reported concern for STD.   HX of Hidradenitis suppurativa and involved in a clinical trial that has been helping. First abscess in 2-3 months.

## 2023-12-26 NOTE — Discharge Instructions (Signed)
 Please read and follow all provided instructions.  Your diagnoses today include:  1. Abscess of left groin    Tests performed today include: Vital signs. See below for your results today.   Medications prescribed:  Clindamycin  - antibiotic  You have been prescribed an antibiotic medicine: take the entire course of medicine even if you are feeling better. Stopping early can cause the antibiotic not to work.  Oxycodone  - narcotic pain medication  DO NOT drive or perform any activities that require you to be awake and alert because this medicine can make you drowsy.   Take any prescribed medications only as directed.   Home care instructions:  Follow any educational materials contained in this packet  Follow-up instructions: Return to the Emergency Department in 48 hours for a recheck if your symptoms are not significantly improved.  Please follow-up with your primary care provider in the next 1 week for further evaluation of your symptoms.   Return instructions:  Return to the Emergency Department if you have: Fever Worsening symptoms Worsening pain Worsening swelling Redness of the skin that moves away from the affected area, especially if it streaks away from the affected area  Any other emergent concerns  Your vital signs today were: BP (!) 157/77 (BP Location: Right Arm)   Pulse 77   Temp 97.7 F (36.5 C)   Resp 18   SpO2 98%  If your blood pressure (BP) was elevated above 135/85 this visit, please have this repeated by your doctor within one month. --------------

## 2023-12-26 NOTE — ED Provider Notes (Signed)
 [place] [facility] AT [facility] Provider Note   CSN: [id number] Arrival date & time: 12/26/23  1435     History  Chief Complaint  Patient presents with   Abscess    Nicholas Farmer is a 23 y.o. male.  Patient with history of hidradenitis suppurativa presents to the emergency department today for evaluation of painful swelling in the left groin.  Area has developed over the past 2 days.  He states that the pain is more severe than areas that he has had in the past.  He was recently on a clinical trial for his HS.  Last dose was 1 to 2 months ago.  He denies fevers.  He has had improvement in the past with clindamycin .       Home Medications Prior to Admission medications   Medication Sig Start Date End Date Taking? Authorizing Provider  clindamycin  (CLEOCIN ) 150 MG capsule Take 2 capsules (300 mg total) by mouth every 6 (six) hours. 12/26/23  Yes Lyna Sandhoff, PA-C  oxyCODONE  (OXY IR/ROXICODONE ) 5 MG immediate release tablet Take 1 tablet (5 mg total) by mouth every 6 (six) hours as needed for severe pain (pain score 7-10). 12/26/23  Yes Lyna Sandhoff, PA-C  acetaminophen  (TYLENOL ) 500 MG tablet Take 500 mg by mouth every 6 (six) hours as needed for moderate pain or headache.    [provider]  Clindamycin -Benzoyl Per, Refr, gel Apply to acne lesions twice a day.  Wash face and let dry before application. 06/10/19   Carlynn Chiles, MD  DIFFERIN  0.1 % gel Apply to acne lesions at bedtime when needed 06/10/19   Carlynn Chiles, MD  doxycycline  (VIBRAMYCIN ) 100 MG capsule Take 1 capsule (100 mg total) by mouth 2 (two) times daily. 11/11/23   Prosperi, Christian H, PA-C  hydroxychloroquine (PLAQUENIL) 200 MG tablet Take 200 mg by mouth daily.    [provider]  ibuprofen  (ADVIL ) 600 MG tablet Take 1 tablet (600 mg total) by mouth every 6 (six) hours as needed. 01/22/20   Lind Repine, MD  ibuprofen  (ADVIL ,MOTRIN ) 200 MG tablet Take 600 mg by  mouth every 6 (six) hours as needed for headache or moderate pain.    [provider]      Allergies    Patient has no known allergies.    Review of Systems   Review of Systems  Physical Exam Updated Vital Signs BP (!) 157/77 (BP Location: Right Arm)   Pulse 77   Temp 97.7 F (36.5 C)   Resp 18   SpO2 98%  Physical Exam Vitals and nursing note reviewed. Exam conducted with a chaperone present.  Constitutional:      Appearance: He is well-developed.  HENT:     Head: Normocephalic and atraumatic.  Eyes:     Conjunctiva/sclera: Conjunctivae normal.  Pulmonary:     Effort: No respiratory distress.  Genitourinary:    Comments: Patient with a 2 cm area of mild induration and fluctuance to the perineal area on the left side, inferior to the scrotum.  There is a small less than 1 cm area of superficial skin ulceration in this area.  No active drainage.  Patient is exquisitely tender to palpation.  No overlying erythema. Musculoskeletal:     Cervical back: Normal range of motion and neck supple.  Skin:    General: Skin is warm and dry.  Neurological:     Mental Status: He is alert.     ED Results / Procedures /  Treatments   Labs (all labs ordered are listed, but only abnormal results are displayed) Labs Reviewed - No data to display  EKG None  Radiology No results found.  Procedures .Incision and Drainage  Date/Time: 12/26/2023 4:43 PM  Performed by: Lyna Sandhoff, PA-C Authorized by: Lyna Sandhoff, PA-C   Consent:    Consent obtained:  Verbal   Consent given by:  Patient   Risks discussed:  Pain, bleeding and damage to other organs Universal protocol:    Patient identity confirmed:  Verbally with patient and provided demographic data Location:    Type:  Abscess   Size:  2cm   Location:  Anogenital   Anogenital location:  Perineum Pre-procedure details:    Skin preparation:  Povidone-iodine Anesthesia:    Anesthesia method:  Local infiltration    Local anesthetic:  Lidocaine  2% WITH epi Procedure type:    Complexity:  Simple Procedure details:    Incision types:  Stab incision   Wound management:  Probed and deloculated   Drainage:  Bloody and purulent   Drainage amount:  Scant   Wound treatment:  Wound left open   Packing materials:  None Post-procedure details:    Procedure completion:  Tolerated well, no immediate complications Comments:     There was a soft clot that was delivered through the wound and some serosanguineous material containing pus as well.     Medications Ordered in ED Medications  lidocaine -EPINEPHrine  (XYLOCAINE  W/EPI) 2 %-1:200000 (PF) injection 20 mL (20 mLs Infiltration Given 12/26/23 1531)    ED Course/ Medical Decision Making/ A&P    Patient seen and examined. History obtained directly from patient.   Labs/EKG: None ordered  Imaging: None ordered  Medications/Fluids: Ordered: Lidocaine  2% with epinephrine   Most recent vital signs reviewed and are as follows: BP (!) 157/77 (BP Location: Right Arm)   Pulse 77   Temp 97.7 F (36.5 C)   Resp 18   SpO2 98%   Initial impression: Patient with likely developing fluid collection or abscess related to HS.   We discussed treatment with antibiotics and warm soaks versus attempt at incision and drainage.  Given the amount of pain that he is in, patient would like to proceed with incision and drainage as this has helped him in the past.  4:44 PM Reassessment performed. Patient appears stable.  Patient tolerated procedure well with pain.  Reviewed pertinent lab work and imaging with patient at bedside. Questions answered.   Most current vital signs reviewed and are as follows: BP (!) 157/77 (BP Location: Right Arm)   Pulse 77   Temp 97.7 F (36.5 C)   Resp 18   SpO2 98%   Plan: Discharge to home.   Prescriptions written for: Clindamycin , oxycodone  # 6 tablets.  Other home care instructions discussed: Warm soaks and compresses  ED return  instructions discussed: The patient was urged to return to the Emergency Department urgently with worsening pain, swelling, expanding erythema especially if it streaks away from the affected area, fever, or if they have any other concerns.   The patient was urged to return to the Emergency Department or go to their PCP in 48 hours for wound recheck if the area is not significantly improved.  The patient verbalized understanding and stated agreement with this plan.   Follow-up instructions discussed: Patient encouraged to follow-up with their PCP as needed.  Medical Decision Making Risk Prescription drug management.   Patient with suspected abscess, enlarging, very painful, and patient with history of hidradenitis.  After discussion of risks and benefits, patient elected for incision and drainage.  This was performed without immediate complication.  Will treat with clinda and pain medication for home.  No indication for lab work or imaging at this time.        Final Clinical Impression(s) / ED Diagnoses Final diagnoses:  Abscess of left groin    Rx / DC Orders ED Discharge Orders          Ordered    clindamycin  (CLEOCIN ) 150 MG capsule  Every 6 hours        12/26/23 1639    oxyCODONE  (OXY IR/ROXICODONE ) 5 MG immediate release tablet  Every 6 hours PRN        12/26/23 1639              Lyna Sandhoff, PA-C 12/26/23 1646    Hershel Los, MD 12/26/23 1704

## 2024-01-03 DIAGNOSIS — L732 Hidradenitis suppurativa: Secondary | ICD-10-CM | POA: Diagnosis not present

## 2024-01-03 DIAGNOSIS — L02214 Cutaneous abscess of groin: Secondary | ICD-10-CM | POA: Diagnosis not present

## 2024-01-03 DIAGNOSIS — Z113 Encounter for screening for infections with a predominantly sexual mode of transmission: Secondary | ICD-10-CM | POA: Diagnosis not present

## 2024-02-05 DIAGNOSIS — Z79899 Other long term (current) drug therapy: Secondary | ICD-10-CM | POA: Diagnosis not present

## 2024-02-05 DIAGNOSIS — L732 Hidradenitis suppurativa: Secondary | ICD-10-CM | POA: Diagnosis not present

## 2024-02-05 DIAGNOSIS — L910 Hypertrophic scar: Secondary | ICD-10-CM | POA: Diagnosis not present

## 2024-02-21 DIAGNOSIS — M1812 Unilateral primary osteoarthritis of first carpometacarpal joint, left hand: Secondary | ICD-10-CM | POA: Diagnosis not present

## 2024-02-21 DIAGNOSIS — M79645 Pain in left finger(s): Secondary | ICD-10-CM | POA: Diagnosis not present

## 2024-06-23 ENCOUNTER — Telehealth (INDEPENDENT_AMBULATORY_CARE_PROVIDER_SITE_OTHER): Payer: Self-pay | Admitting: Primary Care

## 2024-06-23 NOTE — Telephone Encounter (Signed)
 Spoke to pt about upcoming appt.. Will be present

## 2024-06-24 ENCOUNTER — Ambulatory Visit (INDEPENDENT_AMBULATORY_CARE_PROVIDER_SITE_OTHER): Admitting: Primary Care

## 2024-07-19 ENCOUNTER — Telehealth (INDEPENDENT_AMBULATORY_CARE_PROVIDER_SITE_OTHER): Payer: Self-pay | Admitting: Primary Care

## 2024-07-19 NOTE — Telephone Encounter (Signed)
 Called pt to confirm appt. Pt will be present.

## 2024-07-20 ENCOUNTER — Ambulatory Visit (INDEPENDENT_AMBULATORY_CARE_PROVIDER_SITE_OTHER): Admitting: Primary Care
